# Patient Record
Sex: Male | Born: 1935 | Race: White | Hispanic: No | Smoking: Former smoker
Health system: Southern US, Community
[De-identification: ages and names within clinical notes are randomized; demographics above are authoritative.]

## PROBLEM LIST (undated history)

## (undated) DIAGNOSIS — C9590 Leukemia, unspecified not having achieved remission: Secondary | ICD-10-CM

## (undated) DIAGNOSIS — K2270 Barrett's esophagus without dysplasia: Secondary | ICD-10-CM

## (undated) DIAGNOSIS — J45909 Unspecified asthma, uncomplicated: Secondary | ICD-10-CM

## (undated) DIAGNOSIS — C159 Malignant neoplasm of esophagus, unspecified: Secondary | ICD-10-CM

## (undated) HISTORY — PX: TONSILLECTOMY: SUR1361

## (undated) HISTORY — DX: Barrett's esophagus without dysplasia: K22.70

## (undated) HISTORY — DX: Malignant neoplasm of esophagus, unspecified: C15.9

## (undated) HISTORY — PX: APPENDECTOMY: SHX54

---

## 2001-09-20 ENCOUNTER — Other Ambulatory Visit: Admission: RE | Admit: 2001-09-20 | Discharge: 2001-09-20 | Payer: Self-pay | Admitting: Dermatology

## 2005-12-20 ENCOUNTER — Ambulatory Visit: Payer: Self-pay | Admitting: Internal Medicine

## 2006-01-17 ENCOUNTER — Ambulatory Visit: Payer: Self-pay | Admitting: Internal Medicine

## 2011-02-16 DIAGNOSIS — R0789 Other chest pain: Secondary | ICD-10-CM

## 2011-05-30 ENCOUNTER — Other Ambulatory Visit: Payer: Self-pay | Admitting: Radiation Oncology

## 2011-05-30 DIAGNOSIS — C159 Malignant neoplasm of esophagus, unspecified: Secondary | ICD-10-CM

## 2011-06-03 ENCOUNTER — Other Ambulatory Visit (HOSPITAL_COMMUNITY): Payer: Self-pay | Admitting: Internal Medicine

## 2011-06-03 DIAGNOSIS — C159 Malignant neoplasm of esophagus, unspecified: Secondary | ICD-10-CM

## 2011-06-07 ENCOUNTER — Encounter (HOSPITAL_COMMUNITY)
Admission: RE | Admit: 2011-06-07 | Discharge: 2011-06-07 | Disposition: A | Discharge status: Home / Self Care | Payer: Medicare Other | Source: Ambulatory Visit | Attending: Radiation Oncology | Admitting: Radiation Oncology

## 2011-06-07 DIAGNOSIS — C772 Secondary and unspecified malignant neoplasm of intra-abdominal lymph nodes: Secondary | ICD-10-CM | POA: Insufficient documentation

## 2011-06-07 DIAGNOSIS — C159 Malignant neoplasm of esophagus, unspecified: Secondary | ICD-10-CM | POA: Insufficient documentation

## 2011-06-07 LAB — GLUCOSE, CAPILLARY: Glucose-Capillary: 112 mg/dL — ABNORMAL HIGH (ref 70–99)

## 2011-06-07 MED ORDER — FLUDEOXYGLUCOSE F - 18 (FDG) INJECTION
17.9000 | Freq: Once | INTRAVENOUS | Status: AC | PRN
  Administered 2011-06-07: 17.9 via INTRAVENOUS

## 2011-06-13 ENCOUNTER — Encounter (HOSPITAL_COMMUNITY): Payer: Self-pay | Admitting: Pharmacy Technician

## 2011-06-14 ENCOUNTER — Other Ambulatory Visit: Payer: Self-pay | Admitting: Radiology

## 2011-06-14 MED ORDER — DEXTROSE 5 % IV SOLN: 1.0000 g | Freq: Once | INTRAVENOUS | Status: DC

## 2011-06-15 ENCOUNTER — Other Ambulatory Visit (HOSPITAL_COMMUNITY): Payer: Self-pay | Admitting: Internal Medicine

## 2011-06-15 ENCOUNTER — Ambulatory Visit (HOSPITAL_COMMUNITY)
Admission: RE | Admit: 2011-06-15 | Discharge: 2011-06-15 | Disposition: A | Discharge status: Home / Self Care | Payer: Medicare Other | Source: Ambulatory Visit | Attending: Internal Medicine | Admitting: Internal Medicine

## 2011-06-15 VITALS — BP 117/67 | HR 60 | Temp 98.2°F | Resp 18 | Ht 71.0 in | Wt 185.0 lb

## 2011-06-15 DIAGNOSIS — C159 Malignant neoplasm of esophagus, unspecified: Secondary | ICD-10-CM

## 2011-06-15 DIAGNOSIS — Z79899 Other long term (current) drug therapy: Secondary | ICD-10-CM | POA: Insufficient documentation

## 2011-06-15 DIAGNOSIS — Z7982 Long term (current) use of aspirin: Secondary | ICD-10-CM | POA: Insufficient documentation

## 2011-06-15 DIAGNOSIS — Z856 Personal history of leukemia: Secondary | ICD-10-CM | POA: Insufficient documentation

## 2011-06-15 MED ORDER — SODIUM CHLORIDE 0.9 % IR SOLN
Freq: Once | Status: AC
  Administered 2011-06-15: 14:00:00

## 2011-06-15 MED ORDER — SODIUM CHLORIDE 0.9 % IV SOLN: INTRAVENOUS | Status: DC

## 2011-06-15 MED ORDER — FENTANYL CITRATE 0.05 MG/ML IJ SOLN
INTRAMUSCULAR | Status: DC | PRN
  Administered 2011-06-15: 100 ug via INTRAVENOUS

## 2011-06-15 MED ORDER — MIDAZOLAM HCL 5 MG/5ML IJ SOLN
INTRAMUSCULAR | Status: DC | PRN
  Administered 2011-06-15: 2 mg via INTRAVENOUS

## 2011-06-15 MED ORDER — CEFAZOLIN SODIUM 1-5 GM-% IV SOLN
1.0000 g | Freq: Once | INTRAVENOUS | Status: AC
  Administered 2011-06-15: 1 g via INTRAVENOUS
  Filled 2011-06-15: qty 50

## 2011-06-15 NOTE — ED Notes (Signed)
Patient is resting comfortably. 

## 2011-06-15 NOTE — ED Notes (Signed)
Patient denies pain and is resting comfortably.  

## 2011-06-15 NOTE — H&P (Addendum)
Geoffrey Hudson is an 77 y.o. male.   Chief Complaint: Esophogeal cancer HPI: Portacath placement today Dr. Miles Hudson per Dr Geoffrey Hudson order for chemotherapy.  No past medical history on file. 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 No CAD. No DM.  Former smoker - denies COPD.  No past surgical history on file. Chole 16 years ago. Appy and tonsils as a child  No family history on file. Social History:  does not have a smoking history on file. He does not have any smokeless tobacco history on file. His alcohol and drug histories not on file.  Allergies: No Known Allergies  Medications Prior to Admission  Medication Sig Dispense Refill  . aspirin EC 81 MG tablet Take 81 mg by mouth daily.        . Cholecalciferol (VITAMIN D) 2000 UNITS CAPS Take 2,000 Units by mouth daily.        . citalopram (CELEXA) 20 MG tablet Take 20 mg by mouth every morning.        . dutasteride (AVODART) 0.5 MG capsule Take 0.5 mg by mouth daily.        . Fluticasone-Salmeterol (ADVAIR) 100-50 MCG/DOSE AEPB Inhale 1 puff into the lungs 2 (two) times daily.        . montelukast (SINGULAIR) 10 MG tablet Take 10 mg by mouth daily.        . pantoprazole (PROTONIX) 40 MG tablet Take 40 mg by mouth every other day.        . Tamsulosin HCl (FLOMAX) 0.4 MG CAPS Take 0.4 mg by mouth daily.         Medications Prior to Admission  Medication Dose Route Frequency Provider Last Rate Last Dose  . 0.9 %  sodium chloride infusion   Intravenous Continuous Geoffrey Leu, PA      . ceFAZolin (ANCEF) IVPB 1 g/50 mL premix  1 g Intravenous Once Geoffrey Leu, PA      . DISCONTD: ceFAZolin (ANCEF) 1 g in dextrose 5 % 50 mL IVPB  1 g Intravenous Once Geoffrey Leu, PA        No results found for this or any previous visit (from the past 48 hour(s)). No results found.  Review of Systems  Constitutional: Positive for weight loss. Negative for fever and chills.  Respiratory: Negative for shortness of  breath.   Cardiovascular: Negative for chest pain and palpitations.  Gastrointestinal: Negative for abdominal pain.  Musculoskeletal: Positive for myalgias.  Endo/Heme/Allergies: Bruises/bleeds easily.    Blood pressure 120/73, pulse 67, temperature 98.2 F (36.8 C), SpO2 97.00%. Physical Exam  Heent - unremarkable Heart - RRR Lungs - Clear Abd - non tender  Assessment/Plan Portacath placement explained along with risks and benefits. Informed consent obtained.  Placement scheduled for today Dr. Miles Hudson.  Geoffrey Hudson 06/15/2011, 12:28 PM

## 2011-06-15 NOTE — Procedures (Signed)
Successful RT IJ power Port Tip svc/ra Ready to use No comp  Full radiology report dictated

## 2011-07-20 DIAGNOSIS — IMO0002 Reserved for concepts with insufficient information to code with codable children: Secondary | ICD-10-CM | POA: Diagnosis not present

## 2011-07-20 DIAGNOSIS — Z7982 Long term (current) use of aspirin: Secondary | ICD-10-CM | POA: Diagnosis not present

## 2011-07-20 DIAGNOSIS — K5900 Constipation, unspecified: Secondary | ICD-10-CM | POA: Diagnosis not present

## 2011-07-20 DIAGNOSIS — F329 Major depressive disorder, single episode, unspecified: Secondary | ICD-10-CM | POA: Diagnosis not present

## 2011-07-20 DIAGNOSIS — C155 Malignant neoplasm of lower third of esophagus: Secondary | ICD-10-CM | POA: Diagnosis not present

## 2011-07-20 DIAGNOSIS — Z510 Encounter for antineoplastic radiation therapy: Secondary | ICD-10-CM | POA: Diagnosis not present

## 2011-07-20 DIAGNOSIS — Z5111 Encounter for antineoplastic chemotherapy: Secondary | ICD-10-CM | POA: Diagnosis not present

## 2011-07-20 DIAGNOSIS — Z79899 Other long term (current) drug therapy: Secondary | ICD-10-CM | POA: Diagnosis not present

## 2011-07-21 DIAGNOSIS — Z5111 Encounter for antineoplastic chemotherapy: Secondary | ICD-10-CM | POA: Diagnosis not present

## 2011-07-21 DIAGNOSIS — F329 Major depressive disorder, single episode, unspecified: Secondary | ICD-10-CM | POA: Diagnosis not present

## 2011-07-21 DIAGNOSIS — Z79899 Other long term (current) drug therapy: Secondary | ICD-10-CM | POA: Diagnosis not present

## 2011-07-21 DIAGNOSIS — C155 Malignant neoplasm of lower third of esophagus: Secondary | ICD-10-CM | POA: Diagnosis not present

## 2011-07-21 DIAGNOSIS — Z510 Encounter for antineoplastic radiation therapy: Secondary | ICD-10-CM | POA: Diagnosis not present

## 2011-07-21 DIAGNOSIS — K5900 Constipation, unspecified: Secondary | ICD-10-CM | POA: Diagnosis not present

## 2011-07-22 DIAGNOSIS — K5900 Constipation, unspecified: Secondary | ICD-10-CM | POA: Diagnosis not present

## 2011-07-22 DIAGNOSIS — C155 Malignant neoplasm of lower third of esophagus: Secondary | ICD-10-CM | POA: Diagnosis not present

## 2011-07-22 DIAGNOSIS — Z510 Encounter for antineoplastic radiation therapy: Secondary | ICD-10-CM | POA: Diagnosis not present

## 2011-07-22 DIAGNOSIS — Z5111 Encounter for antineoplastic chemotherapy: Secondary | ICD-10-CM | POA: Diagnosis not present

## 2011-07-22 DIAGNOSIS — F329 Major depressive disorder, single episode, unspecified: Secondary | ICD-10-CM | POA: Diagnosis not present

## 2011-07-22 DIAGNOSIS — Z79899 Other long term (current) drug therapy: Secondary | ICD-10-CM | POA: Diagnosis not present

## 2011-07-25 DIAGNOSIS — C155 Malignant neoplasm of lower third of esophagus: Secondary | ICD-10-CM | POA: Diagnosis not present

## 2011-07-25 DIAGNOSIS — K5900 Constipation, unspecified: Secondary | ICD-10-CM | POA: Diagnosis not present

## 2011-07-25 DIAGNOSIS — F329 Major depressive disorder, single episode, unspecified: Secondary | ICD-10-CM | POA: Diagnosis not present

## 2011-07-25 DIAGNOSIS — Z510 Encounter for antineoplastic radiation therapy: Secondary | ICD-10-CM | POA: Diagnosis not present

## 2011-07-25 DIAGNOSIS — Z5111 Encounter for antineoplastic chemotherapy: Secondary | ICD-10-CM | POA: Diagnosis not present

## 2011-07-25 DIAGNOSIS — Z79899 Other long term (current) drug therapy: Secondary | ICD-10-CM | POA: Diagnosis not present

## 2011-07-26 DIAGNOSIS — C155 Malignant neoplasm of lower third of esophagus: Secondary | ICD-10-CM | POA: Diagnosis not present

## 2011-07-26 DIAGNOSIS — Z5111 Encounter for antineoplastic chemotherapy: Secondary | ICD-10-CM | POA: Diagnosis not present

## 2011-07-26 DIAGNOSIS — F329 Major depressive disorder, single episode, unspecified: Secondary | ICD-10-CM | POA: Diagnosis not present

## 2011-07-26 DIAGNOSIS — Z510 Encounter for antineoplastic radiation therapy: Secondary | ICD-10-CM | POA: Diagnosis not present

## 2011-07-26 DIAGNOSIS — Z79899 Other long term (current) drug therapy: Secondary | ICD-10-CM | POA: Diagnosis not present

## 2011-07-26 DIAGNOSIS — K5900 Constipation, unspecified: Secondary | ICD-10-CM | POA: Diagnosis not present

## 2011-07-27 DIAGNOSIS — Z5111 Encounter for antineoplastic chemotherapy: Secondary | ICD-10-CM | POA: Diagnosis not present

## 2011-07-27 DIAGNOSIS — K5900 Constipation, unspecified: Secondary | ICD-10-CM | POA: Diagnosis not present

## 2011-07-27 DIAGNOSIS — Z510 Encounter for antineoplastic radiation therapy: Secondary | ICD-10-CM | POA: Diagnosis not present

## 2011-07-27 DIAGNOSIS — F329 Major depressive disorder, single episode, unspecified: Secondary | ICD-10-CM | POA: Diagnosis not present

## 2011-07-27 DIAGNOSIS — C155 Malignant neoplasm of lower third of esophagus: Secondary | ICD-10-CM | POA: Diagnosis not present

## 2011-07-27 DIAGNOSIS — Z79899 Other long term (current) drug therapy: Secondary | ICD-10-CM | POA: Diagnosis not present

## 2011-07-28 DIAGNOSIS — Z79899 Other long term (current) drug therapy: Secondary | ICD-10-CM | POA: Diagnosis not present

## 2011-07-28 DIAGNOSIS — Z5111 Encounter for antineoplastic chemotherapy: Secondary | ICD-10-CM | POA: Diagnosis not present

## 2011-07-28 DIAGNOSIS — F329 Major depressive disorder, single episode, unspecified: Secondary | ICD-10-CM | POA: Diagnosis not present

## 2011-07-28 DIAGNOSIS — Z510 Encounter for antineoplastic radiation therapy: Secondary | ICD-10-CM | POA: Diagnosis not present

## 2011-07-28 DIAGNOSIS — C155 Malignant neoplasm of lower third of esophagus: Secondary | ICD-10-CM | POA: Diagnosis not present

## 2011-07-28 DIAGNOSIS — K5900 Constipation, unspecified: Secondary | ICD-10-CM | POA: Diagnosis not present

## 2011-07-29 DIAGNOSIS — F329 Major depressive disorder, single episode, unspecified: Secondary | ICD-10-CM | POA: Diagnosis not present

## 2011-07-29 DIAGNOSIS — Z510 Encounter for antineoplastic radiation therapy: Secondary | ICD-10-CM | POA: Diagnosis not present

## 2011-07-29 DIAGNOSIS — Z79899 Other long term (current) drug therapy: Secondary | ICD-10-CM | POA: Diagnosis not present

## 2011-07-29 DIAGNOSIS — IMO0002 Reserved for concepts with insufficient information to code with codable children: Secondary | ICD-10-CM | POA: Diagnosis not present

## 2011-07-29 DIAGNOSIS — K5900 Constipation, unspecified: Secondary | ICD-10-CM | POA: Diagnosis not present

## 2011-07-29 DIAGNOSIS — Z5111 Encounter for antineoplastic chemotherapy: Secondary | ICD-10-CM | POA: Diagnosis not present

## 2011-07-29 DIAGNOSIS — C155 Malignant neoplasm of lower third of esophagus: Secondary | ICD-10-CM | POA: Diagnosis not present

## 2011-07-29 DIAGNOSIS — Z7982 Long term (current) use of aspirin: Secondary | ICD-10-CM | POA: Diagnosis not present

## 2011-08-01 DIAGNOSIS — K5900 Constipation, unspecified: Secondary | ICD-10-CM | POA: Diagnosis not present

## 2011-08-01 DIAGNOSIS — Z79899 Other long term (current) drug therapy: Secondary | ICD-10-CM | POA: Diagnosis not present

## 2011-08-01 DIAGNOSIS — F329 Major depressive disorder, single episode, unspecified: Secondary | ICD-10-CM | POA: Diagnosis not present

## 2011-08-01 DIAGNOSIS — Z510 Encounter for antineoplastic radiation therapy: Secondary | ICD-10-CM | POA: Diagnosis not present

## 2011-08-01 DIAGNOSIS — C155 Malignant neoplasm of lower third of esophagus: Secondary | ICD-10-CM | POA: Diagnosis not present

## 2011-08-01 DIAGNOSIS — Z5111 Encounter for antineoplastic chemotherapy: Secondary | ICD-10-CM | POA: Diagnosis not present

## 2011-08-11 DIAGNOSIS — C159 Malignant neoplasm of esophagus, unspecified: Secondary | ICD-10-CM | POA: Diagnosis not present

## 2011-08-17 DIAGNOSIS — K449 Diaphragmatic hernia without obstruction or gangrene: Secondary | ICD-10-CM | POA: Diagnosis not present

## 2011-08-17 DIAGNOSIS — Z79899 Other long term (current) drug therapy: Secondary | ICD-10-CM | POA: Diagnosis not present

## 2011-08-17 DIAGNOSIS — Z923 Personal history of irradiation: Secondary | ICD-10-CM | POA: Diagnosis not present

## 2011-08-17 DIAGNOSIS — T66XXXS Radiation sickness, unspecified, sequela: Secondary | ICD-10-CM | POA: Diagnosis not present

## 2011-08-17 DIAGNOSIS — Z9221 Personal history of antineoplastic chemotherapy: Secondary | ICD-10-CM | POA: Diagnosis not present

## 2011-08-17 DIAGNOSIS — K2270 Barrett's esophagus without dysplasia: Secondary | ICD-10-CM | POA: Diagnosis not present

## 2011-08-17 DIAGNOSIS — K219 Gastro-esophageal reflux disease without esophagitis: Secondary | ICD-10-CM | POA: Diagnosis not present

## 2011-08-17 DIAGNOSIS — N400 Enlarged prostate without lower urinary tract symptoms: Secondary | ICD-10-CM | POA: Diagnosis not present

## 2011-08-17 DIAGNOSIS — Z7982 Long term (current) use of aspirin: Secondary | ICD-10-CM | POA: Diagnosis not present

## 2011-08-17 DIAGNOSIS — J45909 Unspecified asthma, uncomplicated: Secondary | ICD-10-CM | POA: Diagnosis not present

## 2011-08-17 DIAGNOSIS — C159 Malignant neoplasm of esophagus, unspecified: Secondary | ICD-10-CM | POA: Diagnosis not present

## 2011-08-17 DIAGNOSIS — Z856 Personal history of leukemia: Secondary | ICD-10-CM | POA: Diagnosis not present

## 2011-08-17 DIAGNOSIS — F329 Major depressive disorder, single episode, unspecified: Secondary | ICD-10-CM | POA: Diagnosis not present

## 2011-08-26 DIAGNOSIS — K2289 Other specified disease of esophagus: Secondary | ICD-10-CM | POA: Diagnosis not present

## 2011-08-29 DIAGNOSIS — C9140 Hairy cell leukemia not having achieved remission: Secondary | ICD-10-CM | POA: Diagnosis not present

## 2011-08-29 DIAGNOSIS — Z9221 Personal history of antineoplastic chemotherapy: Secondary | ICD-10-CM | POA: Diagnosis not present

## 2011-08-29 DIAGNOSIS — K5900 Constipation, unspecified: Secondary | ICD-10-CM | POA: Diagnosis not present

## 2011-08-29 DIAGNOSIS — D6481 Anemia due to antineoplastic chemotherapy: Secondary | ICD-10-CM | POA: Diagnosis not present

## 2011-08-29 DIAGNOSIS — Z923 Personal history of irradiation: Secondary | ICD-10-CM | POA: Diagnosis not present

## 2011-08-29 DIAGNOSIS — Z452 Encounter for adjustment and management of vascular access device: Secondary | ICD-10-CM | POA: Diagnosis not present

## 2011-08-29 DIAGNOSIS — T451X5A Adverse effect of antineoplastic and immunosuppressive drugs, initial encounter: Secondary | ICD-10-CM | POA: Diagnosis not present

## 2011-08-29 DIAGNOSIS — Z7982 Long term (current) use of aspirin: Secondary | ICD-10-CM | POA: Diagnosis not present

## 2011-08-29 DIAGNOSIS — C155 Malignant neoplasm of lower third of esophagus: Secondary | ICD-10-CM | POA: Diagnosis not present

## 2011-08-29 DIAGNOSIS — IMO0002 Reserved for concepts with insufficient information to code with codable children: Secondary | ICD-10-CM | POA: Diagnosis not present

## 2011-08-29 DIAGNOSIS — Z79899 Other long term (current) drug therapy: Secondary | ICD-10-CM | POA: Diagnosis not present

## 2011-08-29 DIAGNOSIS — F329 Major depressive disorder, single episode, unspecified: Secondary | ICD-10-CM | POA: Diagnosis not present

## 2011-09-01 DIAGNOSIS — Z452 Encounter for adjustment and management of vascular access device: Secondary | ICD-10-CM | POA: Diagnosis not present

## 2011-09-01 DIAGNOSIS — D6481 Anemia due to antineoplastic chemotherapy: Secondary | ICD-10-CM | POA: Diagnosis not present

## 2011-09-01 DIAGNOSIS — F329 Major depressive disorder, single episode, unspecified: Secondary | ICD-10-CM | POA: Diagnosis not present

## 2011-09-01 DIAGNOSIS — Z79899 Other long term (current) drug therapy: Secondary | ICD-10-CM | POA: Diagnosis not present

## 2011-09-01 DIAGNOSIS — K5900 Constipation, unspecified: Secondary | ICD-10-CM | POA: Diagnosis not present

## 2011-09-01 DIAGNOSIS — C9140 Hairy cell leukemia not having achieved remission: Secondary | ICD-10-CM | POA: Diagnosis not present

## 2011-09-01 DIAGNOSIS — IMO0002 Reserved for concepts with insufficient information to code with codable children: Secondary | ICD-10-CM | POA: Diagnosis not present

## 2011-09-01 DIAGNOSIS — C155 Malignant neoplasm of lower third of esophagus: Secondary | ICD-10-CM | POA: Diagnosis not present

## 2011-09-13 DIAGNOSIS — F329 Major depressive disorder, single episode, unspecified: Secondary | ICD-10-CM | POA: Diagnosis not present

## 2011-09-13 DIAGNOSIS — K5900 Constipation, unspecified: Secondary | ICD-10-CM | POA: Diagnosis not present

## 2011-09-13 DIAGNOSIS — C155 Malignant neoplasm of lower third of esophagus: Secondary | ICD-10-CM | POA: Diagnosis not present

## 2011-09-13 DIAGNOSIS — C9140 Hairy cell leukemia not having achieved remission: Secondary | ICD-10-CM | POA: Diagnosis not present

## 2011-09-13 DIAGNOSIS — Z452 Encounter for adjustment and management of vascular access device: Secondary | ICD-10-CM | POA: Diagnosis not present

## 2011-09-13 DIAGNOSIS — T451X5A Adverse effect of antineoplastic and immunosuppressive drugs, initial encounter: Secondary | ICD-10-CM | POA: Diagnosis not present

## 2011-10-10 DIAGNOSIS — Z800 Family history of malignant neoplasm of digestive organs: Secondary | ICD-10-CM | POA: Diagnosis not present

## 2011-10-10 DIAGNOSIS — K219 Gastro-esophageal reflux disease without esophagitis: Secondary | ICD-10-CM | POA: Diagnosis not present

## 2011-10-10 DIAGNOSIS — C9140 Hairy cell leukemia not having achieved remission: Secondary | ICD-10-CM | POA: Diagnosis not present

## 2011-10-10 DIAGNOSIS — Z09 Encounter for follow-up examination after completed treatment for conditions other than malignant neoplasm: Secondary | ICD-10-CM | POA: Diagnosis not present

## 2011-10-10 DIAGNOSIS — Z91018 Allergy to other foods: Secondary | ICD-10-CM | POA: Diagnosis not present

## 2011-10-10 DIAGNOSIS — C159 Malignant neoplasm of esophagus, unspecified: Secondary | ICD-10-CM | POA: Diagnosis not present

## 2011-10-10 DIAGNOSIS — Z7982 Long term (current) use of aspirin: Secondary | ICD-10-CM | POA: Diagnosis not present

## 2011-10-10 DIAGNOSIS — Z79899 Other long term (current) drug therapy: Secondary | ICD-10-CM | POA: Diagnosis not present

## 2011-10-10 DIAGNOSIS — F17200 Nicotine dependence, unspecified, uncomplicated: Secondary | ICD-10-CM | POA: Diagnosis not present

## 2011-10-25 DIAGNOSIS — Z452 Encounter for adjustment and management of vascular access device: Secondary | ICD-10-CM | POA: Diagnosis not present

## 2011-11-25 DIAGNOSIS — C159 Malignant neoplasm of esophagus, unspecified: Secondary | ICD-10-CM | POA: Diagnosis not present

## 2011-12-06 ENCOUNTER — Encounter: Payer: Medicare Other | Admitting: Internal Medicine

## 2011-12-06 DIAGNOSIS — C159 Malignant neoplasm of esophagus, unspecified: Secondary | ICD-10-CM

## 2011-12-06 DIAGNOSIS — Z452 Encounter for adjustment and management of vascular access device: Secondary | ICD-10-CM

## 2011-12-09 DIAGNOSIS — C159 Malignant neoplasm of esophagus, unspecified: Secondary | ICD-10-CM | POA: Diagnosis not present

## 2011-12-09 DIAGNOSIS — C155 Malignant neoplasm of lower third of esophagus: Secondary | ICD-10-CM | POA: Diagnosis not present

## 2011-12-09 DIAGNOSIS — Z923 Personal history of irradiation: Secondary | ICD-10-CM | POA: Diagnosis not present

## 2012-01-12 DIAGNOSIS — D649 Anemia, unspecified: Secondary | ICD-10-CM | POA: Diagnosis not present

## 2012-01-12 DIAGNOSIS — C159 Malignant neoplasm of esophagus, unspecified: Secondary | ICD-10-CM | POA: Diagnosis not present

## 2012-01-12 DIAGNOSIS — C9140 Hairy cell leukemia not having achieved remission: Secondary | ICD-10-CM | POA: Diagnosis not present

## 2012-01-13 ENCOUNTER — Encounter: Payer: Medicare Other | Admitting: Internal Medicine

## 2012-01-13 DIAGNOSIS — Z452 Encounter for adjustment and management of vascular access device: Secondary | ICD-10-CM | POA: Diagnosis not present

## 2012-01-13 DIAGNOSIS — C159 Malignant neoplasm of esophagus, unspecified: Secondary | ICD-10-CM

## 2012-02-17 DIAGNOSIS — K2270 Barrett's esophagus without dysplasia: Secondary | ICD-10-CM | POA: Diagnosis not present

## 2012-02-17 DIAGNOSIS — C159 Malignant neoplasm of esophagus, unspecified: Secondary | ICD-10-CM | POA: Diagnosis not present

## 2012-02-21 ENCOUNTER — Encounter: Payer: Medicare Other | Admitting: Hematology and Oncology

## 2012-02-21 DIAGNOSIS — C159 Malignant neoplasm of esophagus, unspecified: Secondary | ICD-10-CM | POA: Diagnosis not present

## 2012-02-21 DIAGNOSIS — Z452 Encounter for adjustment and management of vascular access device: Secondary | ICD-10-CM

## 2012-02-22 DIAGNOSIS — C159 Malignant neoplasm of esophagus, unspecified: Secondary | ICD-10-CM | POA: Diagnosis not present

## 2012-02-22 DIAGNOSIS — F329 Major depressive disorder, single episode, unspecified: Secondary | ICD-10-CM | POA: Diagnosis not present

## 2012-02-22 DIAGNOSIS — IMO0002 Reserved for concepts with insufficient information to code with codable children: Secondary | ICD-10-CM | POA: Diagnosis not present

## 2012-02-22 DIAGNOSIS — Z79899 Other long term (current) drug therapy: Secondary | ICD-10-CM | POA: Diagnosis not present

## 2012-02-22 DIAGNOSIS — Z856 Personal history of leukemia: Secondary | ICD-10-CM | POA: Diagnosis not present

## 2012-02-22 DIAGNOSIS — R87619 Unspecified abnormal cytological findings in specimens from cervix uteri: Secondary | ICD-10-CM | POA: Diagnosis not present

## 2012-02-22 DIAGNOSIS — N400 Enlarged prostate without lower urinary tract symptoms: Secondary | ICD-10-CM | POA: Diagnosis not present

## 2012-02-22 DIAGNOSIS — K2270 Barrett's esophagus without dysplasia: Secondary | ICD-10-CM | POA: Diagnosis not present

## 2012-02-22 DIAGNOSIS — K219 Gastro-esophageal reflux disease without esophagitis: Secondary | ICD-10-CM | POA: Diagnosis not present

## 2012-02-22 DIAGNOSIS — K449 Diaphragmatic hernia without obstruction or gangrene: Secondary | ICD-10-CM | POA: Diagnosis not present

## 2012-02-22 DIAGNOSIS — F17200 Nicotine dependence, unspecified, uncomplicated: Secondary | ICD-10-CM | POA: Diagnosis not present

## 2012-02-22 DIAGNOSIS — J45909 Unspecified asthma, uncomplicated: Secondary | ICD-10-CM | POA: Diagnosis not present

## 2012-03-02 DIAGNOSIS — K2270 Barrett's esophagus without dysplasia: Secondary | ICD-10-CM | POA: Diagnosis not present

## 2012-03-26 DIAGNOSIS — R21 Rash and other nonspecific skin eruption: Secondary | ICD-10-CM | POA: Diagnosis not present

## 2012-04-03 ENCOUNTER — Encounter: Payer: Medicare Other | Admitting: Hematology and Oncology

## 2012-04-03 DIAGNOSIS — C159 Malignant neoplasm of esophagus, unspecified: Secondary | ICD-10-CM | POA: Diagnosis not present

## 2012-04-03 DIAGNOSIS — Z452 Encounter for adjustment and management of vascular access device: Secondary | ICD-10-CM

## 2012-04-04 DIAGNOSIS — H26499 Other secondary cataract, unspecified eye: Secondary | ICD-10-CM | POA: Diagnosis not present

## 2012-04-11 DIAGNOSIS — C9140 Hairy cell leukemia not having achieved remission: Secondary | ICD-10-CM | POA: Diagnosis not present

## 2012-05-10 DIAGNOSIS — L821 Other seborrheic keratosis: Secondary | ICD-10-CM | POA: Diagnosis not present

## 2012-05-10 DIAGNOSIS — D235 Other benign neoplasm of skin of trunk: Secondary | ICD-10-CM | POA: Diagnosis not present

## 2012-05-10 DIAGNOSIS — L570 Actinic keratosis: Secondary | ICD-10-CM | POA: Diagnosis not present

## 2012-05-15 DIAGNOSIS — C159 Malignant neoplasm of esophagus, unspecified: Secondary | ICD-10-CM

## 2012-05-15 DIAGNOSIS — Z452 Encounter for adjustment and management of vascular access device: Secondary | ICD-10-CM | POA: Diagnosis not present

## 2012-05-31 DIAGNOSIS — Z8501 Personal history of malignant neoplasm of esophagus: Secondary | ICD-10-CM | POA: Diagnosis not present

## 2012-05-31 DIAGNOSIS — C155 Malignant neoplasm of lower third of esophagus: Secondary | ICD-10-CM | POA: Diagnosis not present

## 2012-05-31 DIAGNOSIS — Z09 Encounter for follow-up examination after completed treatment for conditions other than malignant neoplasm: Secondary | ICD-10-CM | POA: Diagnosis not present

## 2012-06-05 DIAGNOSIS — J309 Allergic rhinitis, unspecified: Secondary | ICD-10-CM | POA: Diagnosis not present

## 2012-06-05 DIAGNOSIS — J45902 Unspecified asthma with status asthmaticus: Secondary | ICD-10-CM | POA: Diagnosis not present

## 2012-06-05 DIAGNOSIS — IMO0002 Reserved for concepts with insufficient information to code with codable children: Secondary | ICD-10-CM | POA: Diagnosis not present

## 2012-06-15 DIAGNOSIS — IMO0002 Reserved for concepts with insufficient information to code with codable children: Secondary | ICD-10-CM | POA: Diagnosis not present

## 2012-06-22 DIAGNOSIS — Z23 Encounter for immunization: Secondary | ICD-10-CM | POA: Diagnosis not present

## 2012-06-26 ENCOUNTER — Encounter: Payer: Medicare Other | Admitting: Internal Medicine

## 2012-06-26 DIAGNOSIS — C159 Malignant neoplasm of esophagus, unspecified: Secondary | ICD-10-CM

## 2012-08-22 DIAGNOSIS — IMO0002 Reserved for concepts with insufficient information to code with codable children: Secondary | ICD-10-CM | POA: Diagnosis not present

## 2012-08-22 DIAGNOSIS — J309 Allergic rhinitis, unspecified: Secondary | ICD-10-CM | POA: Diagnosis not present

## 2012-08-22 DIAGNOSIS — C159 Malignant neoplasm of esophagus, unspecified: Secondary | ICD-10-CM | POA: Diagnosis not present

## 2012-08-22 DIAGNOSIS — J45902 Unspecified asthma with status asthmaticus: Secondary | ICD-10-CM | POA: Diagnosis not present

## 2012-08-28 DIAGNOSIS — C159 Malignant neoplasm of esophagus, unspecified: Secondary | ICD-10-CM | POA: Diagnosis not present

## 2012-08-28 DIAGNOSIS — Z452 Encounter for adjustment and management of vascular access device: Secondary | ICD-10-CM | POA: Diagnosis not present

## 2012-10-15 DIAGNOSIS — D61818 Other pancytopenia: Secondary | ICD-10-CM | POA: Diagnosis not present

## 2012-10-15 DIAGNOSIS — C9140 Hairy cell leukemia not having achieved remission: Secondary | ICD-10-CM | POA: Diagnosis not present

## 2012-10-15 DIAGNOSIS — Z8501 Personal history of malignant neoplasm of esophagus: Secondary | ICD-10-CM | POA: Diagnosis not present

## 2012-10-17 DIAGNOSIS — Z452 Encounter for adjustment and management of vascular access device: Secondary | ICD-10-CM | POA: Diagnosis not present

## 2012-10-17 DIAGNOSIS — C159 Malignant neoplasm of esophagus, unspecified: Secondary | ICD-10-CM

## 2012-11-05 DIAGNOSIS — J309 Allergic rhinitis, unspecified: Secondary | ICD-10-CM | POA: Diagnosis not present

## 2012-11-28 DIAGNOSIS — C159 Malignant neoplasm of esophagus, unspecified: Secondary | ICD-10-CM | POA: Diagnosis not present

## 2012-11-28 DIAGNOSIS — Z452 Encounter for adjustment and management of vascular access device: Secondary | ICD-10-CM

## 2013-01-09 DIAGNOSIS — C159 Malignant neoplasm of esophagus, unspecified: Secondary | ICD-10-CM

## 2013-01-09 DIAGNOSIS — Z452 Encounter for adjustment and management of vascular access device: Secondary | ICD-10-CM | POA: Diagnosis not present

## 2013-01-21 DIAGNOSIS — C159 Malignant neoplasm of esophagus, unspecified: Secondary | ICD-10-CM | POA: Diagnosis not present

## 2013-01-21 DIAGNOSIS — D61818 Other pancytopenia: Secondary | ICD-10-CM | POA: Diagnosis not present

## 2013-01-21 DIAGNOSIS — K219 Gastro-esophageal reflux disease without esophagitis: Secondary | ICD-10-CM | POA: Diagnosis not present

## 2013-01-21 DIAGNOSIS — C9140 Hairy cell leukemia not having achieved remission: Secondary | ICD-10-CM | POA: Diagnosis not present

## 2013-02-20 DIAGNOSIS — Z452 Encounter for adjustment and management of vascular access device: Secondary | ICD-10-CM | POA: Diagnosis not present

## 2013-02-20 DIAGNOSIS — C159 Malignant neoplasm of esophagus, unspecified: Secondary | ICD-10-CM | POA: Diagnosis not present

## 2013-02-22 DIAGNOSIS — C159 Malignant neoplasm of esophagus, unspecified: Secondary | ICD-10-CM | POA: Diagnosis not present

## 2013-03-05 DIAGNOSIS — R42 Dizziness and giddiness: Secondary | ICD-10-CM | POA: Diagnosis not present

## 2013-03-05 DIAGNOSIS — J45902 Unspecified asthma with status asthmaticus: Secondary | ICD-10-CM | POA: Diagnosis not present

## 2013-03-05 DIAGNOSIS — C159 Malignant neoplasm of esophagus, unspecified: Secondary | ICD-10-CM | POA: Diagnosis not present

## 2013-03-05 DIAGNOSIS — J309 Allergic rhinitis, unspecified: Secondary | ICD-10-CM | POA: Diagnosis not present

## 2013-03-05 DIAGNOSIS — IMO0002 Reserved for concepts with insufficient information to code with codable children: Secondary | ICD-10-CM | POA: Diagnosis not present

## 2013-03-05 DIAGNOSIS — N400 Enlarged prostate without lower urinary tract symptoms: Secondary | ICD-10-CM | POA: Diagnosis not present

## 2013-03-05 DIAGNOSIS — R630 Anorexia: Secondary | ICD-10-CM | POA: Diagnosis not present

## 2013-03-14 DIAGNOSIS — C159 Malignant neoplasm of esophagus, unspecified: Secondary | ICD-10-CM | POA: Diagnosis not present

## 2013-03-14 DIAGNOSIS — K2270 Barrett's esophagus without dysplasia: Secondary | ICD-10-CM | POA: Diagnosis not present

## 2013-03-27 DIAGNOSIS — M25559 Pain in unspecified hip: Secondary | ICD-10-CM

## 2013-03-27 DIAGNOSIS — D72819 Decreased white blood cell count, unspecified: Secondary | ICD-10-CM | POA: Diagnosis not present

## 2013-03-27 DIAGNOSIS — C159 Malignant neoplasm of esophagus, unspecified: Secondary | ICD-10-CM | POA: Diagnosis not present

## 2013-03-27 DIAGNOSIS — D61818 Other pancytopenia: Secondary | ICD-10-CM | POA: Diagnosis not present

## 2013-03-27 DIAGNOSIS — D509 Iron deficiency anemia, unspecified: Secondary | ICD-10-CM | POA: Diagnosis not present

## 2013-03-29 DIAGNOSIS — D509 Iron deficiency anemia, unspecified: Secondary | ICD-10-CM

## 2013-03-29 DIAGNOSIS — C159 Malignant neoplasm of esophagus, unspecified: Secondary | ICD-10-CM

## 2013-03-29 DIAGNOSIS — D61818 Other pancytopenia: Secondary | ICD-10-CM | POA: Diagnosis not present

## 2013-04-08 DIAGNOSIS — D509 Iron deficiency anemia, unspecified: Secondary | ICD-10-CM | POA: Diagnosis not present

## 2013-04-09 DIAGNOSIS — C159 Malignant neoplasm of esophagus, unspecified: Secondary | ICD-10-CM | POA: Diagnosis not present

## 2013-04-09 DIAGNOSIS — D61818 Other pancytopenia: Secondary | ICD-10-CM | POA: Diagnosis not present

## 2013-04-12 DIAGNOSIS — K219 Gastro-esophageal reflux disease without esophagitis: Secondary | ICD-10-CM | POA: Diagnosis not present

## 2013-04-12 DIAGNOSIS — Z9221 Personal history of antineoplastic chemotherapy: Secondary | ICD-10-CM | POA: Diagnosis not present

## 2013-04-12 DIAGNOSIS — Z8501 Personal history of malignant neoplasm of esophagus: Secondary | ICD-10-CM | POA: Diagnosis not present

## 2013-04-12 DIAGNOSIS — F17200 Nicotine dependence, unspecified, uncomplicated: Secondary | ICD-10-CM | POA: Diagnosis not present

## 2013-04-12 DIAGNOSIS — K449 Diaphragmatic hernia without obstruction or gangrene: Secondary | ICD-10-CM | POA: Diagnosis not present

## 2013-04-12 DIAGNOSIS — IMO0002 Reserved for concepts with insufficient information to code with codable children: Secondary | ICD-10-CM | POA: Diagnosis not present

## 2013-04-12 DIAGNOSIS — F329 Major depressive disorder, single episode, unspecified: Secondary | ICD-10-CM | POA: Diagnosis not present

## 2013-04-12 DIAGNOSIS — J45909 Unspecified asthma, uncomplicated: Secondary | ICD-10-CM | POA: Diagnosis not present

## 2013-04-12 DIAGNOSIS — Z79899 Other long term (current) drug therapy: Secondary | ICD-10-CM | POA: Diagnosis not present

## 2013-04-12 DIAGNOSIS — Z7982 Long term (current) use of aspirin: Secondary | ICD-10-CM | POA: Diagnosis not present

## 2013-04-12 DIAGNOSIS — K2270 Barrett's esophagus without dysplasia: Secondary | ICD-10-CM | POA: Diagnosis not present

## 2013-04-12 DIAGNOSIS — Z856 Personal history of leukemia: Secondary | ICD-10-CM | POA: Diagnosis not present

## 2013-04-12 DIAGNOSIS — Z8489 Family history of other specified conditions: Secondary | ICD-10-CM | POA: Diagnosis not present

## 2013-04-12 DIAGNOSIS — N400 Enlarged prostate without lower urinary tract symptoms: Secondary | ICD-10-CM | POA: Diagnosis not present

## 2013-04-12 DIAGNOSIS — Z808 Family history of malignant neoplasm of other organs or systems: Secondary | ICD-10-CM | POA: Diagnosis not present

## 2013-04-12 DIAGNOSIS — K2980 Duodenitis without bleeding: Secondary | ICD-10-CM | POA: Diagnosis not present

## 2013-04-12 DIAGNOSIS — K31819 Angiodysplasia of stomach and duodenum without bleeding: Secondary | ICD-10-CM | POA: Diagnosis not present

## 2013-04-12 DIAGNOSIS — Z923 Personal history of irradiation: Secondary | ICD-10-CM | POA: Diagnosis not present

## 2013-04-12 DIAGNOSIS — C159 Malignant neoplasm of esophagus, unspecified: Secondary | ICD-10-CM | POA: Diagnosis not present

## 2013-04-15 DIAGNOSIS — D509 Iron deficiency anemia, unspecified: Secondary | ICD-10-CM

## 2013-04-17 DIAGNOSIS — Z856 Personal history of leukemia: Secondary | ICD-10-CM

## 2013-04-17 DIAGNOSIS — C159 Malignant neoplasm of esophagus, unspecified: Secondary | ICD-10-CM

## 2013-04-17 DIAGNOSIS — D61818 Other pancytopenia: Secondary | ICD-10-CM | POA: Diagnosis not present

## 2013-04-17 DIAGNOSIS — C9140 Hairy cell leukemia not having achieved remission: Secondary | ICD-10-CM | POA: Diagnosis not present

## 2013-04-17 DIAGNOSIS — D696 Thrombocytopenia, unspecified: Secondary | ICD-10-CM

## 2013-04-17 DIAGNOSIS — D72819 Decreased white blood cell count, unspecified: Secondary | ICD-10-CM | POA: Diagnosis not present

## 2013-04-22 DIAGNOSIS — Z23 Encounter for immunization: Secondary | ICD-10-CM | POA: Diagnosis not present

## 2013-05-13 DIAGNOSIS — C9140 Hairy cell leukemia not having achieved remission: Secondary | ICD-10-CM | POA: Diagnosis not present

## 2013-05-13 DIAGNOSIS — D61818 Other pancytopenia: Secondary | ICD-10-CM | POA: Diagnosis not present

## 2013-05-13 DIAGNOSIS — C159 Malignant neoplasm of esophagus, unspecified: Secondary | ICD-10-CM

## 2013-05-15 DIAGNOSIS — C159 Malignant neoplasm of esophagus, unspecified: Secondary | ICD-10-CM | POA: Diagnosis not present

## 2013-05-15 DIAGNOSIS — Z452 Encounter for adjustment and management of vascular access device: Secondary | ICD-10-CM | POA: Diagnosis not present

## 2013-06-03 DIAGNOSIS — C9140 Hairy cell leukemia not having achieved remission: Secondary | ICD-10-CM | POA: Diagnosis not present

## 2013-06-06 DIAGNOSIS — M7150 Other bursitis, not elsewhere classified, unspecified site: Secondary | ICD-10-CM | POA: Diagnosis not present

## 2013-06-06 DIAGNOSIS — Z23 Encounter for immunization: Secondary | ICD-10-CM | POA: Diagnosis not present

## 2013-06-26 DIAGNOSIS — C159 Malignant neoplasm of esophagus, unspecified: Secondary | ICD-10-CM

## 2013-06-26 DIAGNOSIS — Z452 Encounter for adjustment and management of vascular access device: Secondary | ICD-10-CM

## 2013-06-28 DIAGNOSIS — M7150 Other bursitis, not elsewhere classified, unspecified site: Secondary | ICD-10-CM | POA: Diagnosis not present

## 2013-07-05 DIAGNOSIS — J45902 Unspecified asthma with status asthmaticus: Secondary | ICD-10-CM | POA: Diagnosis not present

## 2013-07-05 DIAGNOSIS — J019 Acute sinusitis, unspecified: Secondary | ICD-10-CM | POA: Diagnosis not present

## 2013-07-15 DIAGNOSIS — C159 Malignant neoplasm of esophagus, unspecified: Secondary | ICD-10-CM | POA: Diagnosis not present

## 2013-07-15 DIAGNOSIS — D759 Disease of blood and blood-forming organs, unspecified: Secondary | ICD-10-CM | POA: Diagnosis not present

## 2013-07-29 DIAGNOSIS — D7589 Other specified diseases of blood and blood-forming organs: Secondary | ICD-10-CM | POA: Diagnosis not present

## 2013-07-29 DIAGNOSIS — D696 Thrombocytopenia, unspecified: Secondary | ICD-10-CM | POA: Diagnosis not present

## 2013-07-29 DIAGNOSIS — D649 Anemia, unspecified: Secondary | ICD-10-CM | POA: Diagnosis not present

## 2013-07-29 DIAGNOSIS — D61818 Other pancytopenia: Secondary | ICD-10-CM | POA: Diagnosis not present

## 2013-08-07 DIAGNOSIS — C159 Malignant neoplasm of esophagus, unspecified: Secondary | ICD-10-CM | POA: Diagnosis not present

## 2013-08-07 DIAGNOSIS — Z9889 Other specified postprocedural states: Secondary | ICD-10-CM | POA: Diagnosis not present

## 2013-08-19 DIAGNOSIS — N400 Enlarged prostate without lower urinary tract symptoms: Secondary | ICD-10-CM | POA: Diagnosis not present

## 2013-08-19 DIAGNOSIS — R42 Dizziness and giddiness: Secondary | ICD-10-CM | POA: Diagnosis not present

## 2013-08-19 DIAGNOSIS — R630 Anorexia: Secondary | ICD-10-CM | POA: Diagnosis not present

## 2013-08-19 DIAGNOSIS — C159 Malignant neoplasm of esophagus, unspecified: Secondary | ICD-10-CM | POA: Diagnosis not present

## 2013-08-19 DIAGNOSIS — IMO0002 Reserved for concepts with insufficient information to code with codable children: Secondary | ICD-10-CM | POA: Diagnosis not present

## 2013-08-19 DIAGNOSIS — J309 Allergic rhinitis, unspecified: Secondary | ICD-10-CM | POA: Diagnosis not present

## 2013-08-19 DIAGNOSIS — J45902 Unspecified asthma with status asthmaticus: Secondary | ICD-10-CM | POA: Diagnosis not present

## 2013-08-19 DIAGNOSIS — K2100 Gastro-esophageal reflux disease with esophagitis, without bleeding: Secondary | ICD-10-CM | POA: Diagnosis not present

## 2013-09-18 DIAGNOSIS — N400 Enlarged prostate without lower urinary tract symptoms: Secondary | ICD-10-CM | POA: Diagnosis not present

## 2013-09-18 DIAGNOSIS — Z9889 Other specified postprocedural states: Secondary | ICD-10-CM | POA: Diagnosis not present

## 2013-09-18 DIAGNOSIS — C9140 Hairy cell leukemia not having achieved remission: Secondary | ICD-10-CM | POA: Diagnosis not present

## 2013-09-18 DIAGNOSIS — C159 Malignant neoplasm of esophagus, unspecified: Secondary | ICD-10-CM | POA: Diagnosis not present

## 2013-10-04 DIAGNOSIS — IMO0002 Reserved for concepts with insufficient information to code with codable children: Secondary | ICD-10-CM | POA: Diagnosis not present

## 2013-10-07 IMAGING — PT NM PET TUM IMG INITIAL (PI) SKULL BASE T - THIGH
6 series · 25 of 25 positions shown · non-contrast
Comparison: 

CLINICAL DATA: Initial treatment strategy for esophageal cancer.

NUCLEAR MEDICINE PET SKULL BASE TO THIGH
Fasting Blood Glucose:  112
TECHNIQUE: 17.9 mCi F-18 FDG was injected intravenously via the
right antecubital fossa.  Full-ring PET imaging was performed from
the skull base through the mid-thighs 64  minutes after injection.
CT data was obtained and used for attenuation correction and
anatomic localization only.  (This was not acquired as a diagnostic
CT examination.)

[Series 1: pet ac · axial · 3.3mm · 4.69mm/px · z∈[-870,+0]mm · 5 of 267 slices shown]
[im 1/267]
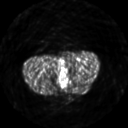
[im 67/267]
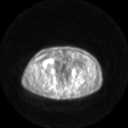
[im 134/267]
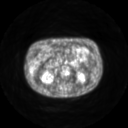
[im 200/267]
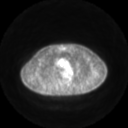
[im 267/267]
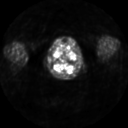

[Series 2: pet nac · axial · 3.3mm · 4.69mm/px · z∈[-870,+0]mm · 6 of 267 slices shown]
[im 1/267]
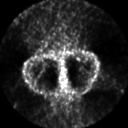
[im 54/267]
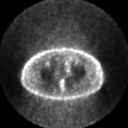
[im 107/267]
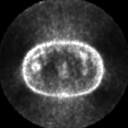
[im 160/267]
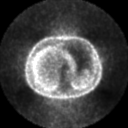
[im 213/267]
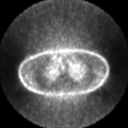
[im 267/267]
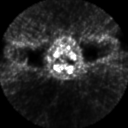

[Series 2: ct images · axial · 3.8mm · 0.98mm/px · z∈[-870,+0]mm · 5 of 265 slices shown]
[im 1/265]
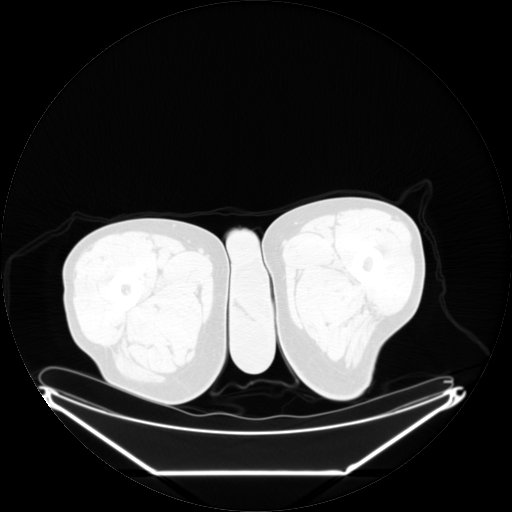
[im 67/265]
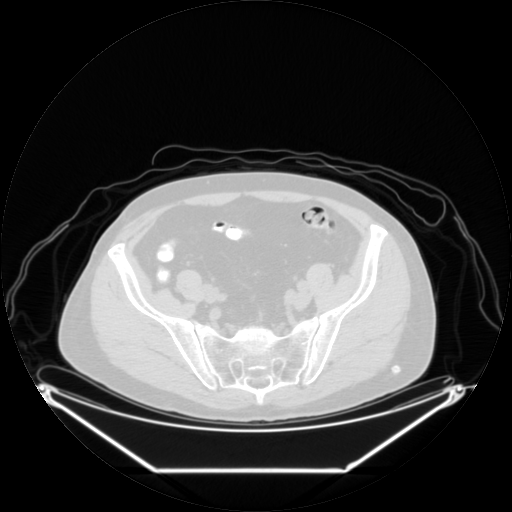
[im 133/265]
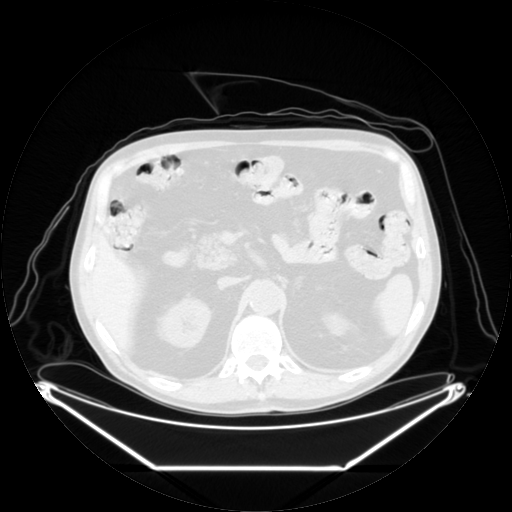
[im 199/265]
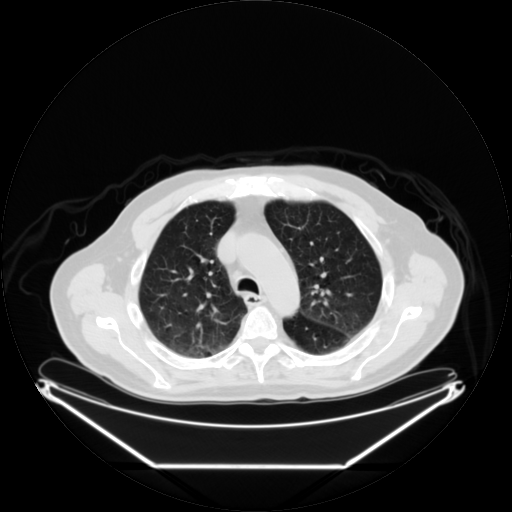
[im 265/265]
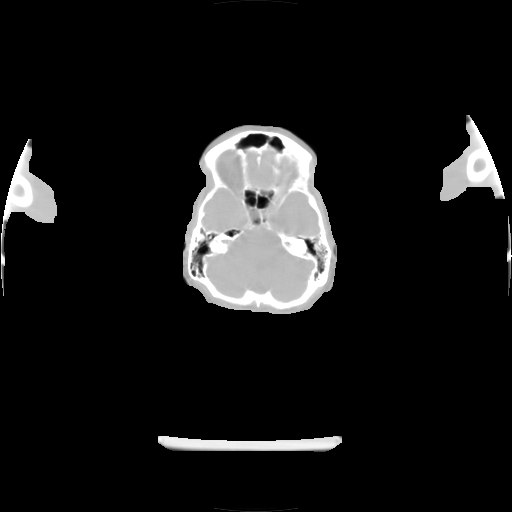

[Series 123: mip · coronal · 3.3mm · 4.69mm/px · 1 of 30 slices shown]
[im 1/30]
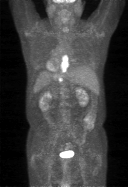

[Series 151: reformatted · axial · 3.3mm · 3.91mm/px · z∈[-870,+0]mm · 6 of 265 slices shown (1 of 2)]
[im 1/265]
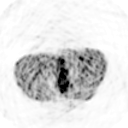
[im 53/265]
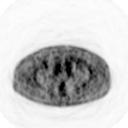
[im 106/265]
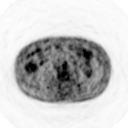
[im 159/265]
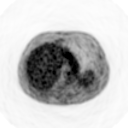
[im 212/265]
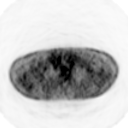
[im 265/265]
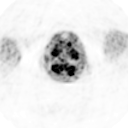

[Series 153: reformatted · coronal · 4.7mm · 6.98mm/px · 2 of 70 slices shown (2 of 2)]
[im 1/70]
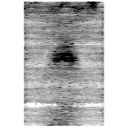
[im 70/70]
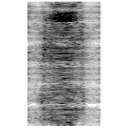

[25 of 25 positions shown; findings below may reference images not displayed]

FINDINGS: Neck:No hypermetabolic nodes in the neck.

Chest:There is a long segment (9 cm) of circumferential
hypermetabolic activity within the distal esophagus extending from
the level of the carina to the GE junction with intense metabolic
activity ( SUV max = 17.9).

There are small paratracheal lymph nodes which are not
hypermetabolic.

Review of the lung windows demonstrates no suspicious pulmonary
nodules.

Abdomen / Pelvis:There is a hypermetabolic gastrohepatic ligament
lymph node which measures 19 mm short axis (image 118) with SUV max
= 12.1.  No additional hypermetabolic abdominal  or retroperitoneal
lymph nodes.
No hypermetabolic lesions within the liver.

Skeleton:Review of  bone windows demonstrates no aggressive osseous
lesions.
IMPRESSION: 1.  Long segment of intense hypermetabolic activity in the distal
esophagus consistent primary esophageal carcinoma.
2.  Local nodal metastasis within a hypermetabolic gastrohepatic
ligament node.
3.  No additional evidence of nodal metastasis or distant
metastasis.

## 2013-10-14 DIAGNOSIS — H26499 Other secondary cataract, unspecified eye: Secondary | ICD-10-CM | POA: Diagnosis not present

## 2013-11-13 DIAGNOSIS — C9140 Hairy cell leukemia not having achieved remission: Secondary | ICD-10-CM | POA: Diagnosis not present

## 2013-11-13 DIAGNOSIS — C159 Malignant neoplasm of esophagus, unspecified: Secondary | ICD-10-CM | POA: Diagnosis not present

## 2013-11-13 DIAGNOSIS — D759 Disease of blood and blood-forming organs, unspecified: Secondary | ICD-10-CM | POA: Diagnosis not present

## 2013-11-13 DIAGNOSIS — Z9889 Other specified postprocedural states: Secondary | ICD-10-CM | POA: Diagnosis not present

## 2013-12-16 DIAGNOSIS — J45902 Unspecified asthma with status asthmaticus: Secondary | ICD-10-CM | POA: Diagnosis not present

## 2013-12-16 DIAGNOSIS — IMO0002 Reserved for concepts with insufficient information to code with codable children: Secondary | ICD-10-CM | POA: Diagnosis not present

## 2013-12-16 DIAGNOSIS — K2100 Gastro-esophageal reflux disease with esophagitis, without bleeding: Secondary | ICD-10-CM | POA: Diagnosis not present

## 2013-12-16 DIAGNOSIS — N400 Enlarged prostate without lower urinary tract symptoms: Secondary | ICD-10-CM | POA: Diagnosis not present

## 2013-12-16 DIAGNOSIS — C159 Malignant neoplasm of esophagus, unspecified: Secondary | ICD-10-CM | POA: Diagnosis not present

## 2013-12-16 DIAGNOSIS — R42 Dizziness and giddiness: Secondary | ICD-10-CM | POA: Diagnosis not present

## 2014-01-01 DIAGNOSIS — Z9889 Other specified postprocedural states: Secondary | ICD-10-CM | POA: Diagnosis not present

## 2014-01-01 DIAGNOSIS — C159 Malignant neoplasm of esophagus, unspecified: Secondary | ICD-10-CM | POA: Diagnosis not present

## 2014-01-01 DIAGNOSIS — C9140 Hairy cell leukemia not having achieved remission: Secondary | ICD-10-CM | POA: Diagnosis not present

## 2014-01-01 DIAGNOSIS — D472 Monoclonal gammopathy: Secondary | ICD-10-CM | POA: Diagnosis not present

## 2014-01-01 DIAGNOSIS — D61818 Other pancytopenia: Secondary | ICD-10-CM | POA: Diagnosis not present

## 2014-02-12 DIAGNOSIS — Z9889 Other specified postprocedural states: Secondary | ICD-10-CM | POA: Diagnosis not present

## 2014-02-27 DIAGNOSIS — T148 Other injury of unspecified body region: Secondary | ICD-10-CM | POA: Diagnosis not present

## 2014-02-27 DIAGNOSIS — L259 Unspecified contact dermatitis, unspecified cause: Secondary | ICD-10-CM | POA: Diagnosis not present

## 2014-02-27 DIAGNOSIS — W57XXXA Bitten or stung by nonvenomous insect and other nonvenomous arthropods, initial encounter: Secondary | ICD-10-CM | POA: Diagnosis not present

## 2014-03-10 DIAGNOSIS — Z9221 Personal history of antineoplastic chemotherapy: Secondary | ICD-10-CM | POA: Diagnosis not present

## 2014-03-10 DIAGNOSIS — Z856 Personal history of leukemia: Secondary | ICD-10-CM | POA: Diagnosis not present

## 2014-03-10 DIAGNOSIS — Z923 Personal history of irradiation: Secondary | ICD-10-CM | POA: Diagnosis not present

## 2014-03-10 DIAGNOSIS — C9140 Hairy cell leukemia not having achieved remission: Secondary | ICD-10-CM | POA: Diagnosis not present

## 2014-03-10 DIAGNOSIS — Z8501 Personal history of malignant neoplasm of esophagus: Secondary | ICD-10-CM | POA: Diagnosis not present

## 2014-03-10 DIAGNOSIS — D61818 Other pancytopenia: Secondary | ICD-10-CM | POA: Diagnosis not present

## 2014-03-25 DIAGNOSIS — Z9889 Other specified postprocedural states: Secondary | ICD-10-CM | POA: Diagnosis not present

## 2014-04-04 DIAGNOSIS — H903 Sensorineural hearing loss, bilateral: Secondary | ICD-10-CM | POA: Diagnosis not present

## 2014-05-01 DIAGNOSIS — Z23 Encounter for immunization: Secondary | ICD-10-CM | POA: Diagnosis not present

## 2014-05-07 DIAGNOSIS — C159 Malignant neoplasm of esophagus, unspecified: Secondary | ICD-10-CM | POA: Diagnosis not present

## 2014-05-07 DIAGNOSIS — Z95828 Presence of other vascular implants and grafts: Secondary | ICD-10-CM | POA: Diagnosis not present

## 2014-05-08 DIAGNOSIS — N401 Enlarged prostate with lower urinary tract symptoms: Secondary | ICD-10-CM | POA: Diagnosis not present

## 2014-05-08 DIAGNOSIS — J45909 Unspecified asthma, uncomplicated: Secondary | ICD-10-CM | POA: Diagnosis not present

## 2014-05-08 DIAGNOSIS — K210 Gastro-esophageal reflux disease with esophagitis: Secondary | ICD-10-CM | POA: Diagnosis not present

## 2014-05-08 DIAGNOSIS — F325 Major depressive disorder, single episode, in full remission: Secondary | ICD-10-CM | POA: Diagnosis not present

## 2014-06-05 DIAGNOSIS — C159 Malignant neoplasm of esophagus, unspecified: Secondary | ICD-10-CM | POA: Diagnosis not present

## 2014-06-18 DIAGNOSIS — Z95828 Presence of other vascular implants and grafts: Secondary | ICD-10-CM | POA: Diagnosis not present

## 2014-07-30 DIAGNOSIS — Z95828 Presence of other vascular implants and grafts: Secondary | ICD-10-CM | POA: Diagnosis not present

## 2014-08-07 ENCOUNTER — Emergency Department (HOSPITAL_COMMUNITY): Payer: Medicare Other

## 2014-08-07 ENCOUNTER — Encounter (HOSPITAL_COMMUNITY): Payer: Self-pay | Admitting: Emergency Medicine

## 2014-08-07 ENCOUNTER — Emergency Department (HOSPITAL_COMMUNITY)
Admission: EM | Admit: 2014-08-07 | Discharge: 2014-08-07 | Disposition: A | Discharge status: Home / Self Care | Payer: Medicare Other | Attending: Emergency Medicine | Admitting: Emergency Medicine

## 2014-08-07 DIAGNOSIS — Z7951 Long term (current) use of inhaled steroids: Secondary | ICD-10-CM | POA: Diagnosis not present

## 2014-08-07 DIAGNOSIS — M25551 Pain in right hip: Secondary | ICD-10-CM | POA: Diagnosis not present

## 2014-08-07 DIAGNOSIS — J45909 Unspecified asthma, uncomplicated: Secondary | ICD-10-CM | POA: Diagnosis not present

## 2014-08-07 DIAGNOSIS — Y998 Other external cause status: Secondary | ICD-10-CM | POA: Diagnosis not present

## 2014-08-07 DIAGNOSIS — Z7982 Long term (current) use of aspirin: Secondary | ICD-10-CM | POA: Diagnosis not present

## 2014-08-07 DIAGNOSIS — Z79899 Other long term (current) drug therapy: Secondary | ICD-10-CM | POA: Diagnosis not present

## 2014-08-07 DIAGNOSIS — W01198A Fall on same level from slipping, tripping and stumbling with subsequent striking against other object, initial encounter: Secondary | ICD-10-CM | POA: Insufficient documentation

## 2014-08-07 DIAGNOSIS — Z856 Personal history of leukemia: Secondary | ICD-10-CM | POA: Insufficient documentation

## 2014-08-07 DIAGNOSIS — Z87891 Personal history of nicotine dependence: Secondary | ICD-10-CM | POA: Diagnosis not present

## 2014-08-07 DIAGNOSIS — S51012A Laceration without foreign body of left elbow, initial encounter: Secondary | ICD-10-CM

## 2014-08-07 DIAGNOSIS — W108XXA Fall (on) (from) other stairs and steps, initial encounter: Secondary | ICD-10-CM | POA: Insufficient documentation

## 2014-08-07 DIAGNOSIS — Y92015 Private garage of single-family (private) house as the place of occurrence of the external cause: Secondary | ICD-10-CM | POA: Diagnosis not present

## 2014-08-07 DIAGNOSIS — S79911A Unspecified injury of right hip, initial encounter: Secondary | ICD-10-CM | POA: Diagnosis not present

## 2014-08-07 DIAGNOSIS — Y9389 Activity, other specified: Secondary | ICD-10-CM | POA: Diagnosis not present

## 2014-08-07 DIAGNOSIS — S7001XA Contusion of right hip, initial encounter: Secondary | ICD-10-CM

## 2014-08-07 DIAGNOSIS — S59902A Unspecified injury of left elbow, initial encounter: Secondary | ICD-10-CM | POA: Diagnosis present

## 2014-08-07 DIAGNOSIS — R52 Pain, unspecified: Secondary | ICD-10-CM

## 2014-08-07 HISTORY — DX: Leukemia, unspecified not having achieved remission: C95.90

## 2014-08-07 HISTORY — DX: Unspecified asthma, uncomplicated: J45.909

## 2014-08-07 LAB — CBC WITH DIFFERENTIAL/PLATELET
BASOS ABS: 0 10*3/uL (ref 0.0–0.1)
BASOS PCT: 0 % (ref 0–1)
EOS ABS: 0 10*3/uL (ref 0.0–0.7)
EOS PCT: 1 % (ref 0–5)
HEMATOCRIT: 33.1 % — AB (ref 39.0–52.0)
Hemoglobin: 10.7 g/dL — ABNORMAL LOW (ref 13.0–17.0)
LYMPHS ABS: 0.4 10*3/uL — AB (ref 0.7–4.0)
Lymphocytes Relative: 8 % — ABNORMAL LOW (ref 12–46)
MCH: 34.9 pg — ABNORMAL HIGH (ref 26.0–34.0)
MCHC: 32.3 g/dL (ref 30.0–36.0)
MCV: 107.8 fL — ABNORMAL HIGH (ref 78.0–100.0)
Monocytes Absolute: 0.3 10*3/uL (ref 0.1–1.0)
Monocytes Relative: 5 % (ref 3–12)
NEUTROS ABS: 4.7 10*3/uL (ref 1.7–7.7)
Neutrophils Relative %: 86 % — ABNORMAL HIGH (ref 43–77)
PLATELETS: 86 10*3/uL — AB (ref 150–400)
RBC: 3.07 MIL/uL — AB (ref 4.22–5.81)
RDW: 13.9 % (ref 11.5–15.5)
WBC: 5.4 10*3/uL (ref 4.0–10.5)

## 2014-08-07 LAB — ABO/RH: ABO/RH(D): A POS

## 2014-08-07 LAB — BASIC METABOLIC PANEL
ANION GAP: 6 (ref 5–15)
BUN: 25 mg/dL — ABNORMAL HIGH (ref 6–23)
CALCIUM: 8.9 mg/dL (ref 8.4–10.5)
CO2: 24 mmol/L (ref 19–32)
CREATININE: 1.18 mg/dL (ref 0.50–1.35)
Chloride: 107 mEq/L (ref 96–112)
GFR calc non Af Amer: 55 mL/min — ABNORMAL LOW (ref >90)
GFR, EST AFRICAN AMERICAN: 64 mL/min — AB (ref >90)
GLUCOSE: 97 mg/dL (ref 70–99)
Potassium: 4.1 mmol/L (ref 3.5–5.1)
SODIUM: 137 mmol/L (ref 135–145)

## 2014-08-07 LAB — PROTIME-INR
INR: 1.14 (ref 0.00–1.49)
PROTHROMBIN TIME: 14.7 s (ref 11.6–15.2)

## 2014-08-07 LAB — TYPE AND SCREEN
ABO/RH(D): A POS
ANTIBODY SCREEN: NEGATIVE

## 2014-08-07 MED ORDER — FENTANYL CITRATE 0.05 MG/ML IJ SOLN
50.0000 ug | INTRAMUSCULAR | Status: DC | PRN
  Administered 2014-08-07: 50 ug via INTRAVENOUS
  Filled 2014-08-07: qty 2

## 2014-08-07 MED ORDER — HYDROCODONE-ACETAMINOPHEN 5-325 MG PO TABS: 1.0000 | ORAL_TABLET | ORAL | Status: DC | PRN

## 2014-08-07 MED ORDER — BACITRACIN ZINC 500 UNIT/GM EX OINT
1.0000 "application " | TOPICAL_OINTMENT | Freq: Once | CUTANEOUS | Status: AC
  Administered 2014-08-07: 1 via TOPICAL
  Filled 2014-08-07: qty 0.9

## 2014-08-07 NOTE — ED Notes (Signed)
Pt. Able to ambulate within room and out in the hallway,  Initially limping on the right leg and claimed of pain at 3/10  , able to ambulate outside room with tolarateble pain , steady gait, no s/s  Of distress noted.
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
## 2014-08-07 NOTE — ED Notes (Signed)
MD Tomi Bamberger at bedside

## 2014-08-07 NOTE — ED Notes (Signed)
Attempted to ambulate pt. , pt. Claimed of pain at 10/10 upon standing up, requested this time for the PAIN MED . Will be medicated and will try to ambulate pt. After medicated. To notify MD.

## 2014-08-07 NOTE — ED Notes (Signed)
Patient transported to X-ray 

## 2014-08-07 NOTE — ED Notes (Signed)
Pt. Ready for discharge , awaiting for daughter to pick him up.

## 2014-08-07 NOTE — ED Provider Notes (Signed)
CSN: [id number]     Arrival date & time 08/07/14  1519 History   First MD Initiated Contact with Patient 08/07/14 1528     Chief Complaint  Patient presents with  . Fall  . Hip Pain   HPI Pt was walking down a 2 steps into his garage and missed a step and fell.  He landed on his right hip onto a concrete floor.  He also hit his L elbow. He was able to crouch and get back up into his work shop but had a lot of pain.  He has not been able to walk really since that time.  He called ems and was brought to the ED.   When he is sitting, the pain is not bad at all.  No LOC. Past Medical History  Diagnosis Date  . Asthma   . Leukemia    Past Surgical History  Procedure Laterality Date  . Appendectomy    . Tonsillectomy     No family history on file. History  Substance Use Topics  . Smoking status: Former Smoker    Types: Cigarettes  . Smokeless tobacco: Not on file  . Alcohol Use: Yes     Comment: social    Review of Systems  Respiratory: Negative for choking.   Cardiovascular: Negative for chest pain.  Gastrointestinal: Negative for abdominal pain.  Genitourinary: Negative for hematuria.  Neurological: Negative for headaches.  All other systems reviewed and are negative.     Allergies  Review of patient's allergies indicates no known allergies.  Home Medications   Prior to Admission medications   Medication Sig Start Date End Date Taking? Authorizing Provider  aspirin EC 81 MG tablet Take 81 mg by mouth daily.     Yes Historical Provider, MD  Cholecalciferol (VITAMIN D) 2000 UNITS CAPS Take 2,000 Units by mouth daily.     Yes Historical Provider, MD  dutasteride (AVODART) 0.5 MG capsule Take 0.5 mg by mouth daily.     Yes Historical Provider, MD  Fluticasone-Salmeterol (ADVAIR) 100-50 MCG/DOSE AEPB Inhale 1 puff into the lungs 2 (two) times daily.     Yes Historical Provider, MD  methocarbamol (ROBAXIN) 500 MG tablet Take 500 mg by mouth 2 (two) times daily.   Yes  Historical Provider, MD  montelukast (SINGULAIR) 10 MG tablet Take 10 mg by mouth daily.     Yes Historical Provider, MD  pantoprazole (PROTONIX) 40 MG tablet Take 40 mg by mouth every other day.     Yes Historical Provider, MD  Tamsulosin HCl (FLOMAX) 0.4 MG CAPS Take 0.4 mg by mouth daily.     Yes Historical Provider, MD   BP 113/72 mmHg  Pulse 74  Temp(Src) 98.9 F (37.2 C) (Oral)  Resp 18  SpO2 94% Physical Exam  Constitutional: He appears well-developed and well-nourished. No distress.  HENT:  Head: Normocephalic and atraumatic.  Right Ear: External ear normal.  Left Ear: External ear normal.  Eyes: Conjunctivae are normal. Right eye exhibits no discharge. Left eye exhibits no discharge. No scleral icterus.  Neck: Neck supple. No tracheal deviation present.  Cardiovascular: Normal rate, regular rhythm and intact distal pulses.   Pulmonary/Chest: Effort normal and breath sounds normal. No stridor. No respiratory distress. He has no wheezes. He has no rales.  Abdominal: Soft. Bowel sounds are normal. He exhibits no distension. There is no tenderness. There is no rebound and no guarding.  Musculoskeletal: He exhibits no edema.       Right wrist:  Normal.       Left wrist: Normal.       Right hip: He exhibits tenderness. He exhibits no swelling and no deformity.       Left hip: Normal.       Cervical back: Normal.  Position of comfort is with knee flexed and hip externally rotated, small superificial skin tear left elbow, full rom, no ttp  Neurological: He is alert. He has normal strength. No cranial nerve deficit (no facial droop, extraocular movements intact, no slurred speech) or sensory deficit. He exhibits normal muscle tone. He displays no seizure activity. Coordination normal.  Skin: Skin is warm and dry. No rash noted.  Psychiatric: He has a normal mood and affect.  Nursing note and vitals reviewed.   ED Course  Procedures (including critical care time) Labs Review Labs  Reviewed  BASIC METABOLIC PANEL - Abnormal; Notable for the following:    BUN 25 (*)    GFR calc non Af Amer 55 (*)    GFR calc Af Amer 64 (*)    All other components within normal limits  CBC WITH DIFFERENTIAL - Abnormal; Notable for the following:    RBC 3.07 (*)    Hemoglobin 10.7 (*)    HCT 33.1 (*)    MCV 107.8 (*)    MCH 34.9 (*)    Platelets 86 (*)    Neutrophils Relative % 86 (*)    Lymphocytes Relative 8 (*)    Lymphs Abs 0.4 (*)    All other components within normal limits  PROTIME-INR  TYPE AND SCREEN  ABO/RH    Imaging Review Ct Hip Right Wo Contrast  08/07/2014   CLINICAL DATA:  Fall today. Unable to bear weight on right hip. Negative radiographs.  EXAM: CT OF THE RIGHT HIP WITHOUT CONTRAST  TECHNIQUE: Multidetector CT imaging of the right hip was performed according to the standard protocol. Multiplanar CT image reconstructions were also generated.  COMPARISON:  Current right hip radiographs  FINDINGS: No fracture. Hip joint is normally aligned. No significant arthropathic change. No joint effusion.  5.8 cm Lipoma lies in the anterior musculature. No evidence of a muscle strain or hematoma.  IMPRESSION: No fracture.  No significant joint abnormality.   Electronically Signed   By: Lajean Manes M.D.   On: 08/07/2014 18:35   Dg Hip Unilat With Pelvis 2-3 Views Right  08/07/2014   CLINICAL DATA:  Tripped over stepped with fall with right hip pain, initial encounter  EXAM: DG HIP W/ PELVIS 2-3V*R*  COMPARISON:  None.  FINDINGS: Pelvic ring is intact. No fracture or dislocation is seen. No gross soft tissue abnormality is noted.  IMPRESSION: No acute abnormality noted.   Electronically Signed   By: Inez Catalina M.D.   On: 08/07/2014 16:32     MDM   Final diagnoses:  Pain  Contusion, hip, right, initial encounter  Elbow laceration, left, initial encounter    Initial plain films negative.  Pt has not been able to walk.  Will ct hip.  Pt has a portacath in right chest.   Does not believe he can get an MRI.  CT scan negative.  Pt's pain is improving.  Will make sure he can walk.  Elbow laceration is just a small superificial skin tear.  Local wound care in the ED.      Dorie Rank, MD 08/07/14 [phone]

## 2014-08-07 NOTE — Progress Notes (Signed)
CSW met with patient at bedside. There was no family present. Patient confirms that he presents to the ED today today because of fall. According to patient, he missed a 2nd step while in his garage at home today, which caused the fall. Patient states that prior to coming into the ED he has been able to complete his ADL's independently. Patient says he lives alone but his daughter usually comes and stays with him for half of the week. Patient informed CSW that he lives in [place], [place].   Patient states that he feels a lot better than when initially came into the ED.  Willette Brace [phone] ED CSW 08/07/2014 11:26 PM'

## 2014-08-07 NOTE — ED Notes (Addendum)
Pt from home via [facility] c/o right hip pain from a fall in which he tripped over a step. NO LOC and no blood thinners. NO shortening or rotation. Pt feels more comfortable with knee bent upward. Skin tear to left elbow, bleeding controlled.

## 2014-09-03 DIAGNOSIS — C159 Malignant neoplasm of esophagus, unspecified: Secondary | ICD-10-CM | POA: Diagnosis not present

## 2014-09-03 DIAGNOSIS — C9140 Hairy cell leukemia not having achieved remission: Secondary | ICD-10-CM | POA: Diagnosis not present

## 2014-09-03 DIAGNOSIS — D519 Vitamin B12 deficiency anemia, unspecified: Secondary | ICD-10-CM | POA: Diagnosis not present

## 2014-09-03 DIAGNOSIS — D696 Thrombocytopenia, unspecified: Secondary | ICD-10-CM | POA: Diagnosis not present

## 2014-09-03 DIAGNOSIS — D759 Disease of blood and blood-forming organs, unspecified: Secondary | ICD-10-CM | POA: Diagnosis not present

## 2014-09-08 DIAGNOSIS — Z8501 Personal history of malignant neoplasm of esophagus: Secondary | ICD-10-CM | POA: Diagnosis not present

## 2014-09-08 DIAGNOSIS — Z91018 Allergy to other foods: Secondary | ICD-10-CM | POA: Diagnosis not present

## 2014-09-08 DIAGNOSIS — Z889 Allergy status to unspecified drugs, medicaments and biological substances status: Secondary | ICD-10-CM | POA: Diagnosis not present

## 2014-09-08 DIAGNOSIS — C159 Malignant neoplasm of esophagus, unspecified: Secondary | ICD-10-CM | POA: Diagnosis not present

## 2014-09-08 DIAGNOSIS — D696 Thrombocytopenia, unspecified: Secondary | ICD-10-CM | POA: Diagnosis not present

## 2014-09-08 DIAGNOSIS — D61818 Other pancytopenia: Secondary | ICD-10-CM | POA: Diagnosis not present

## 2014-09-08 DIAGNOSIS — D759 Disease of blood and blood-forming organs, unspecified: Secondary | ICD-10-CM | POA: Diagnosis not present

## 2014-09-08 DIAGNOSIS — D519 Vitamin B12 deficiency anemia, unspecified: Secondary | ICD-10-CM | POA: Diagnosis not present

## 2014-09-08 DIAGNOSIS — C9140 Hairy cell leukemia not having achieved remission: Secondary | ICD-10-CM | POA: Diagnosis not present

## 2014-09-10 DIAGNOSIS — Z95828 Presence of other vascular implants and grafts: Secondary | ICD-10-CM | POA: Diagnosis not present

## 2014-09-15 DIAGNOSIS — R600 Localized edema: Secondary | ICD-10-CM | POA: Diagnosis not present

## 2014-09-16 DIAGNOSIS — R600 Localized edema: Secondary | ICD-10-CM | POA: Diagnosis not present

## 2014-09-16 DIAGNOSIS — M79604 Pain in right leg: Secondary | ICD-10-CM | POA: Diagnosis not present

## 2014-09-16 DIAGNOSIS — M7989 Other specified soft tissue disorders: Secondary | ICD-10-CM | POA: Diagnosis not present

## 2014-09-24 DIAGNOSIS — C9140 Hairy cell leukemia not having achieved remission: Secondary | ICD-10-CM | POA: Diagnosis not present

## 2014-09-24 DIAGNOSIS — D638 Anemia in other chronic diseases classified elsewhere: Secondary | ICD-10-CM | POA: Diagnosis not present

## 2014-09-24 DIAGNOSIS — C159 Malignant neoplasm of esophagus, unspecified: Secondary | ICD-10-CM | POA: Diagnosis not present

## 2014-09-24 DIAGNOSIS — D696 Thrombocytopenia, unspecified: Secondary | ICD-10-CM | POA: Diagnosis not present

## 2014-10-06 DIAGNOSIS — Z1389 Encounter for screening for other disorder: Secondary | ICD-10-CM | POA: Diagnosis not present

## 2014-10-06 DIAGNOSIS — K210 Gastro-esophageal reflux disease with esophagitis: Secondary | ICD-10-CM | POA: Diagnosis not present

## 2014-10-06 DIAGNOSIS — J45909 Unspecified asthma, uncomplicated: Secondary | ICD-10-CM | POA: Diagnosis not present

## 2014-10-06 DIAGNOSIS — N401 Enlarged prostate with lower urinary tract symptoms: Secondary | ICD-10-CM | POA: Diagnosis not present

## 2014-10-06 DIAGNOSIS — F325 Major depressive disorder, single episode, in full remission: Secondary | ICD-10-CM | POA: Diagnosis not present

## 2014-10-22 DIAGNOSIS — Z95828 Presence of other vascular implants and grafts: Secondary | ICD-10-CM | POA: Diagnosis not present

## 2014-12-03 DIAGNOSIS — Z95828 Presence of other vascular implants and grafts: Secondary | ICD-10-CM | POA: Diagnosis not present

## 2014-12-22 DIAGNOSIS — Z961 Presence of intraocular lens: Secondary | ICD-10-CM | POA: Diagnosis not present

## 2014-12-25 ENCOUNTER — Other Ambulatory Visit (HOSPITAL_COMMUNITY): Payer: Self-pay | Admitting: Internal Medicine

## 2014-12-25 DIAGNOSIS — C9141 Hairy cell leukemia, in remission: Secondary | ICD-10-CM | POA: Diagnosis not present

## 2014-12-25 DIAGNOSIS — D638 Anemia in other chronic diseases classified elsewhere: Secondary | ICD-10-CM | POA: Diagnosis not present

## 2014-12-25 DIAGNOSIS — Z95828 Presence of other vascular implants and grafts: Secondary | ICD-10-CM | POA: Diagnosis not present

## 2014-12-25 DIAGNOSIS — C159 Malignant neoplasm of esophagus, unspecified: Secondary | ICD-10-CM

## 2014-12-25 DIAGNOSIS — D61818 Other pancytopenia: Secondary | ICD-10-CM | POA: Diagnosis not present

## 2014-12-25 DIAGNOSIS — C9140 Hairy cell leukemia not having achieved remission: Secondary | ICD-10-CM | POA: Diagnosis not present

## 2014-12-25 DIAGNOSIS — D472 Monoclonal gammopathy: Secondary | ICD-10-CM | POA: Diagnosis not present

## 2014-12-25 DIAGNOSIS — K219 Gastro-esophageal reflux disease without esophagitis: Secondary | ICD-10-CM | POA: Diagnosis not present

## 2015-01-14 DIAGNOSIS — Z95828 Presence of other vascular implants and grafts: Secondary | ICD-10-CM | POA: Diagnosis not present

## 2015-01-14 DIAGNOSIS — C9140 Hairy cell leukemia not having achieved remission: Secondary | ICD-10-CM | POA: Diagnosis not present

## 2015-01-28 DIAGNOSIS — F325 Major depressive disorder, single episode, in full remission: Secondary | ICD-10-CM | POA: Diagnosis not present

## 2015-01-28 DIAGNOSIS — K210 Gastro-esophageal reflux disease with esophagitis: Secondary | ICD-10-CM | POA: Diagnosis not present

## 2015-01-28 DIAGNOSIS — N401 Enlarged prostate with lower urinary tract symptoms: Secondary | ICD-10-CM | POA: Diagnosis not present

## 2015-01-28 DIAGNOSIS — J45909 Unspecified asthma, uncomplicated: Secondary | ICD-10-CM | POA: Diagnosis not present

## 2015-02-18 ENCOUNTER — Ambulatory Visit (HOSPITAL_COMMUNITY)
Admission: RE | Admit: 2015-02-18 | Discharge: 2015-02-18 | Disposition: A | Discharge status: Home / Self Care | Payer: Medicare Other | Source: Ambulatory Visit | Attending: Internal Medicine | Admitting: Internal Medicine

## 2015-02-18 DIAGNOSIS — D179 Benign lipomatous neoplasm, unspecified: Secondary | ICD-10-CM | POA: Diagnosis not present

## 2015-02-18 DIAGNOSIS — J439 Emphysema, unspecified: Secondary | ICD-10-CM | POA: Diagnosis not present

## 2015-02-18 DIAGNOSIS — Z79899 Other long term (current) drug therapy: Secondary | ICD-10-CM | POA: Diagnosis not present

## 2015-02-18 DIAGNOSIS — N289 Disorder of kidney and ureter, unspecified: Secondary | ICD-10-CM | POA: Insufficient documentation

## 2015-02-18 DIAGNOSIS — I700 Atherosclerosis of aorta: Secondary | ICD-10-CM | POA: Insufficient documentation

## 2015-02-18 DIAGNOSIS — R918 Other nonspecific abnormal finding of lung field: Secondary | ICD-10-CM | POA: Diagnosis not present

## 2015-02-18 DIAGNOSIS — K4020 Bilateral inguinal hernia, without obstruction or gangrene, not specified as recurrent: Secondary | ICD-10-CM | POA: Diagnosis not present

## 2015-02-18 DIAGNOSIS — C159 Malignant neoplasm of esophagus, unspecified: Secondary | ICD-10-CM | POA: Diagnosis not present

## 2015-02-18 DIAGNOSIS — K5730 Diverticulosis of large intestine without perforation or abscess without bleeding: Secondary | ICD-10-CM | POA: Insufficient documentation

## 2015-02-18 LAB — GLUCOSE, CAPILLARY: GLUCOSE-CAPILLARY: 101 mg/dL — AB (ref 65–99)

## 2015-02-18 MED ORDER — FLUDEOXYGLUCOSE F - 18 (FDG) INJECTION
9.2100 | Freq: Once | INTRAVENOUS | Status: AC | PRN
  Administered 2015-02-18: 9.21 via INTRAVENOUS

## 2015-02-25 DIAGNOSIS — M1990 Unspecified osteoarthritis, unspecified site: Secondary | ICD-10-CM | POA: Diagnosis not present

## 2015-02-25 DIAGNOSIS — Z9221 Personal history of antineoplastic chemotherapy: Secondary | ICD-10-CM | POA: Diagnosis not present

## 2015-02-25 DIAGNOSIS — C159 Malignant neoplasm of esophagus, unspecified: Secondary | ICD-10-CM | POA: Diagnosis not present

## 2015-02-25 DIAGNOSIS — D638 Anemia in other chronic diseases classified elsewhere: Secondary | ICD-10-CM | POA: Diagnosis not present

## 2015-02-25 DIAGNOSIS — C9140 Hairy cell leukemia not having achieved remission: Secondary | ICD-10-CM | POA: Diagnosis not present

## 2015-02-25 DIAGNOSIS — D472 Monoclonal gammopathy: Secondary | ICD-10-CM | POA: Diagnosis not present

## 2015-02-25 DIAGNOSIS — Z95828 Presence of other vascular implants and grafts: Secondary | ICD-10-CM | POA: Diagnosis not present

## 2015-03-09 DIAGNOSIS — Z856 Personal history of leukemia: Secondary | ICD-10-CM | POA: Diagnosis not present

## 2015-03-09 DIAGNOSIS — C9140 Hairy cell leukemia not having achieved remission: Secondary | ICD-10-CM | POA: Diagnosis not present

## 2015-03-09 DIAGNOSIS — D61818 Other pancytopenia: Secondary | ICD-10-CM | POA: Diagnosis not present

## 2015-03-09 DIAGNOSIS — Z8501 Personal history of malignant neoplasm of esophagus: Secondary | ICD-10-CM | POA: Diagnosis not present

## 2015-03-09 DIAGNOSIS — M1990 Unspecified osteoarthritis, unspecified site: Secondary | ICD-10-CM | POA: Diagnosis not present

## 2015-03-11 DIAGNOSIS — D531 Other megaloblastic anemias, not elsewhere classified: Secondary | ICD-10-CM | POA: Diagnosis not present

## 2015-03-11 DIAGNOSIS — M160 Bilateral primary osteoarthritis of hip: Secondary | ICD-10-CM | POA: Diagnosis not present

## 2015-04-08 DIAGNOSIS — N400 Enlarged prostate without lower urinary tract symptoms: Secondary | ICD-10-CM | POA: Diagnosis not present

## 2015-04-08 DIAGNOSIS — Z95828 Presence of other vascular implants and grafts: Secondary | ICD-10-CM | POA: Diagnosis not present

## 2015-04-22 DIAGNOSIS — M160 Bilateral primary osteoarthritis of hip: Secondary | ICD-10-CM | POA: Diagnosis not present

## 2015-04-22 DIAGNOSIS — D531 Other megaloblastic anemias, not elsewhere classified: Secondary | ICD-10-CM | POA: Diagnosis not present

## 2015-04-22 DIAGNOSIS — Z23 Encounter for immunization: Secondary | ICD-10-CM | POA: Diagnosis not present

## 2015-05-04 DIAGNOSIS — D61818 Other pancytopenia: Secondary | ICD-10-CM | POA: Diagnosis not present

## 2015-05-04 DIAGNOSIS — C9140 Hairy cell leukemia not having achieved remission: Secondary | ICD-10-CM | POA: Diagnosis not present

## 2015-05-04 DIAGNOSIS — Z95828 Presence of other vascular implants and grafts: Secondary | ICD-10-CM | POA: Diagnosis not present

## 2015-05-04 DIAGNOSIS — Z7982 Long term (current) use of aspirin: Secondary | ICD-10-CM | POA: Diagnosis not present

## 2015-05-04 DIAGNOSIS — C9141 Hairy cell leukemia, in remission: Secondary | ICD-10-CM | POA: Diagnosis not present

## 2015-05-20 DIAGNOSIS — Z95828 Presence of other vascular implants and grafts: Secondary | ICD-10-CM | POA: Diagnosis not present

## 2015-05-20 DIAGNOSIS — D472 Monoclonal gammopathy: Secondary | ICD-10-CM | POA: Diagnosis not present

## 2015-05-26 DIAGNOSIS — D696 Thrombocytopenia, unspecified: Secondary | ICD-10-CM | POA: Diagnosis not present

## 2015-05-26 DIAGNOSIS — K210 Gastro-esophageal reflux disease with esophagitis: Secondary | ICD-10-CM | POA: Diagnosis not present

## 2015-05-26 DIAGNOSIS — J45909 Unspecified asthma, uncomplicated: Secondary | ICD-10-CM | POA: Diagnosis not present

## 2015-05-26 DIAGNOSIS — M160 Bilateral primary osteoarthritis of hip: Secondary | ICD-10-CM | POA: Diagnosis not present

## 2015-05-26 DIAGNOSIS — F325 Major depressive disorder, single episode, in full remission: Secondary | ICD-10-CM | POA: Diagnosis not present

## 2015-05-26 DIAGNOSIS — C9142 Hairy cell leukemia, in relapse: Secondary | ICD-10-CM | POA: Diagnosis not present

## 2015-05-26 DIAGNOSIS — N401 Enlarged prostate with lower urinary tract symptoms: Secondary | ICD-10-CM | POA: Diagnosis not present

## 2015-05-26 DIAGNOSIS — D649 Anemia, unspecified: Secondary | ICD-10-CM | POA: Diagnosis not present

## 2015-07-01 DIAGNOSIS — Z95828 Presence of other vascular implants and grafts: Secondary | ICD-10-CM | POA: Diagnosis not present

## 2015-07-15 DIAGNOSIS — C159 Malignant neoplasm of esophagus, unspecified: Secondary | ICD-10-CM | POA: Diagnosis not present

## 2015-07-15 DIAGNOSIS — K22710 Barrett's esophagus with low grade dysplasia: Secondary | ICD-10-CM | POA: Diagnosis not present

## 2015-07-17 DIAGNOSIS — Z7982 Long term (current) use of aspirin: Secondary | ICD-10-CM | POA: Diagnosis not present

## 2015-07-17 DIAGNOSIS — C159 Malignant neoplasm of esophagus, unspecified: Secondary | ICD-10-CM | POA: Diagnosis not present

## 2015-07-17 DIAGNOSIS — Z856 Personal history of leukemia: Secondary | ICD-10-CM | POA: Diagnosis not present

## 2015-07-17 DIAGNOSIS — Z7951 Long term (current) use of inhaled steroids: Secondary | ICD-10-CM | POA: Diagnosis not present

## 2015-07-17 DIAGNOSIS — Z808 Family history of malignant neoplasm of other organs or systems: Secondary | ICD-10-CM | POA: Diagnosis not present

## 2015-07-17 DIAGNOSIS — K22710 Barrett's esophagus with low grade dysplasia: Secondary | ICD-10-CM | POA: Diagnosis not present

## 2015-07-17 DIAGNOSIS — Z9221 Personal history of antineoplastic chemotherapy: Secondary | ICD-10-CM | POA: Diagnosis not present

## 2015-07-17 DIAGNOSIS — Z8501 Personal history of malignant neoplasm of esophagus: Secondary | ICD-10-CM | POA: Diagnosis not present

## 2015-07-17 DIAGNOSIS — F17210 Nicotine dependence, cigarettes, uncomplicated: Secondary | ICD-10-CM | POA: Diagnosis not present

## 2015-07-17 DIAGNOSIS — K2270 Barrett's esophagus without dysplasia: Secondary | ICD-10-CM | POA: Diagnosis not present

## 2015-07-17 DIAGNOSIS — Z923 Personal history of irradiation: Secondary | ICD-10-CM | POA: Diagnosis not present

## 2015-07-17 DIAGNOSIS — Z9049 Acquired absence of other specified parts of digestive tract: Secondary | ICD-10-CM | POA: Diagnosis not present

## 2015-07-17 DIAGNOSIS — Z8249 Family history of ischemic heart disease and other diseases of the circulatory system: Secondary | ICD-10-CM | POA: Diagnosis not present

## 2015-07-17 DIAGNOSIS — Z79899 Other long term (current) drug therapy: Secondary | ICD-10-CM | POA: Diagnosis not present

## 2015-07-17 DIAGNOSIS — F329 Major depressive disorder, single episode, unspecified: Secondary | ICD-10-CM | POA: Diagnosis not present

## 2015-07-17 DIAGNOSIS — J45909 Unspecified asthma, uncomplicated: Secondary | ICD-10-CM | POA: Diagnosis not present

## 2015-07-17 DIAGNOSIS — K219 Gastro-esophageal reflux disease without esophagitis: Secondary | ICD-10-CM | POA: Diagnosis not present

## 2015-07-17 DIAGNOSIS — N400 Enlarged prostate without lower urinary tract symptoms: Secondary | ICD-10-CM | POA: Diagnosis not present

## 2015-07-17 DIAGNOSIS — K449 Diaphragmatic hernia without obstruction or gangrene: Secondary | ICD-10-CM | POA: Diagnosis not present

## 2015-07-22 DIAGNOSIS — R05 Cough: Secondary | ICD-10-CM | POA: Diagnosis not present

## 2015-07-22 DIAGNOSIS — J069 Acute upper respiratory infection, unspecified: Secondary | ICD-10-CM | POA: Diagnosis not present

## 2015-08-10 DIAGNOSIS — D649 Anemia, unspecified: Secondary | ICD-10-CM | POA: Diagnosis not present

## 2015-08-10 DIAGNOSIS — Z95828 Presence of other vascular implants and grafts: Secondary | ICD-10-CM | POA: Diagnosis not present

## 2015-08-10 DIAGNOSIS — Z8501 Personal history of malignant neoplasm of esophagus: Secondary | ICD-10-CM | POA: Diagnosis not present

## 2015-08-10 DIAGNOSIS — C9141 Hairy cell leukemia, in remission: Secondary | ICD-10-CM | POA: Diagnosis not present

## 2015-08-10 DIAGNOSIS — D696 Thrombocytopenia, unspecified: Secondary | ICD-10-CM | POA: Diagnosis not present

## 2015-08-10 DIAGNOSIS — C9140 Hairy cell leukemia not having achieved remission: Secondary | ICD-10-CM | POA: Diagnosis not present

## 2015-08-28 DIAGNOSIS — M1990 Unspecified osteoarthritis, unspecified site: Secondary | ICD-10-CM | POA: Diagnosis not present

## 2015-08-28 DIAGNOSIS — C159 Malignant neoplasm of esophagus, unspecified: Secondary | ICD-10-CM | POA: Diagnosis not present

## 2015-08-28 DIAGNOSIS — C9141 Hairy cell leukemia, in remission: Secondary | ICD-10-CM | POA: Diagnosis not present

## 2015-08-28 DIAGNOSIS — Z95828 Presence of other vascular implants and grafts: Secondary | ICD-10-CM | POA: Diagnosis not present

## 2015-08-28 DIAGNOSIS — Z9221 Personal history of antineoplastic chemotherapy: Secondary | ICD-10-CM | POA: Diagnosis not present

## 2015-08-28 DIAGNOSIS — D696 Thrombocytopenia, unspecified: Secondary | ICD-10-CM | POA: Diagnosis not present

## 2015-09-17 DIAGNOSIS — D696 Thrombocytopenia, unspecified: Secondary | ICD-10-CM | POA: Diagnosis not present

## 2015-09-17 DIAGNOSIS — C9142 Hairy cell leukemia, in relapse: Secondary | ICD-10-CM | POA: Diagnosis not present

## 2015-09-17 DIAGNOSIS — N401 Enlarged prostate with lower urinary tract symptoms: Secondary | ICD-10-CM | POA: Diagnosis not present

## 2015-09-17 DIAGNOSIS — F325 Major depressive disorder, single episode, in full remission: Secondary | ICD-10-CM | POA: Diagnosis not present

## 2015-09-17 DIAGNOSIS — K210 Gastro-esophageal reflux disease with esophagitis: Secondary | ICD-10-CM | POA: Diagnosis not present

## 2015-09-17 DIAGNOSIS — J45909 Unspecified asthma, uncomplicated: Secondary | ICD-10-CM | POA: Diagnosis not present

## 2015-09-17 DIAGNOSIS — M160 Bilateral primary osteoarthritis of hip: Secondary | ICD-10-CM | POA: Diagnosis not present

## 2015-09-17 DIAGNOSIS — D649 Anemia, unspecified: Secondary | ICD-10-CM | POA: Diagnosis not present

## 2015-10-09 DIAGNOSIS — Z9221 Personal history of antineoplastic chemotherapy: Secondary | ICD-10-CM | POA: Diagnosis not present

## 2015-10-09 DIAGNOSIS — D472 Monoclonal gammopathy: Secondary | ICD-10-CM | POA: Diagnosis not present

## 2015-10-09 DIAGNOSIS — C9141 Hairy cell leukemia, in remission: Secondary | ICD-10-CM | POA: Diagnosis not present

## 2015-10-09 DIAGNOSIS — Z95828 Presence of other vascular implants and grafts: Secondary | ICD-10-CM | POA: Diagnosis not present

## 2015-11-06 DIAGNOSIS — R042 Hemoptysis: Secondary | ICD-10-CM | POA: Diagnosis not present

## 2015-11-09 DIAGNOSIS — D696 Thrombocytopenia, unspecified: Secondary | ICD-10-CM | POA: Diagnosis not present

## 2015-11-09 DIAGNOSIS — C159 Malignant neoplasm of esophagus, unspecified: Secondary | ICD-10-CM | POA: Diagnosis not present

## 2015-11-09 DIAGNOSIS — C9141 Hairy cell leukemia, in remission: Secondary | ICD-10-CM | POA: Diagnosis not present

## 2015-11-09 DIAGNOSIS — Z7982 Long term (current) use of aspirin: Secondary | ICD-10-CM | POA: Diagnosis not present

## 2015-11-09 DIAGNOSIS — D649 Anemia, unspecified: Secondary | ICD-10-CM | POA: Diagnosis not present

## 2015-11-20 DIAGNOSIS — Z95828 Presence of other vascular implants and grafts: Secondary | ICD-10-CM | POA: Diagnosis not present

## 2016-01-04 DIAGNOSIS — C9141 Hairy cell leukemia, in remission: Secondary | ICD-10-CM | POA: Diagnosis not present

## 2016-01-04 DIAGNOSIS — Z95828 Presence of other vascular implants and grafts: Secondary | ICD-10-CM | POA: Diagnosis not present

## 2016-01-04 DIAGNOSIS — D472 Monoclonal gammopathy: Secondary | ICD-10-CM | POA: Diagnosis not present

## 2016-02-18 DIAGNOSIS — G3184 Mild cognitive impairment, so stated: Secondary | ICD-10-CM | POA: Diagnosis not present

## 2016-02-18 DIAGNOSIS — Z6826 Body mass index (BMI) 26.0-26.9, adult: Secondary | ICD-10-CM | POA: Diagnosis not present

## 2016-02-24 DIAGNOSIS — Z452 Encounter for adjustment and management of vascular access device: Secondary | ICD-10-CM | POA: Diagnosis not present

## 2016-03-14 DIAGNOSIS — K210 Gastro-esophageal reflux disease with esophagitis: Secondary | ICD-10-CM | POA: Diagnosis not present

## 2016-03-14 DIAGNOSIS — F325 Major depressive disorder, single episode, in full remission: Secondary | ICD-10-CM | POA: Diagnosis not present

## 2016-03-14 DIAGNOSIS — C9142 Hairy cell leukemia, in relapse: Secondary | ICD-10-CM | POA: Diagnosis not present

## 2016-03-14 DIAGNOSIS — R4189 Other symptoms and signs involving cognitive functions and awareness: Secondary | ICD-10-CM | POA: Diagnosis not present

## 2016-03-14 DIAGNOSIS — D531 Other megaloblastic anemias, not elsewhere classified: Secondary | ICD-10-CM | POA: Diagnosis not present

## 2016-03-14 DIAGNOSIS — G3184 Mild cognitive impairment, so stated: Secondary | ICD-10-CM | POA: Diagnosis not present

## 2016-03-14 DIAGNOSIS — D696 Thrombocytopenia, unspecified: Secondary | ICD-10-CM | POA: Diagnosis not present

## 2016-03-14 DIAGNOSIS — E785 Hyperlipidemia, unspecified: Secondary | ICD-10-CM | POA: Diagnosis not present

## 2016-03-15 ENCOUNTER — Other Ambulatory Visit: Payer: Self-pay

## 2016-03-23 DIAGNOSIS — J45909 Unspecified asthma, uncomplicated: Secondary | ICD-10-CM | POA: Diagnosis not present

## 2016-03-23 DIAGNOSIS — D649 Anemia, unspecified: Secondary | ICD-10-CM | POA: Diagnosis not present

## 2016-03-23 DIAGNOSIS — Z6826 Body mass index (BMI) 26.0-26.9, adult: Secondary | ICD-10-CM | POA: Diagnosis not present

## 2016-03-23 DIAGNOSIS — N401 Enlarged prostate with lower urinary tract symptoms: Secondary | ICD-10-CM | POA: Diagnosis not present

## 2016-03-23 DIAGNOSIS — K210 Gastro-esophageal reflux disease with esophagitis: Secondary | ICD-10-CM | POA: Diagnosis not present

## 2016-03-23 DIAGNOSIS — D696 Thrombocytopenia, unspecified: Secondary | ICD-10-CM | POA: Diagnosis not present

## 2016-03-23 DIAGNOSIS — Z23 Encounter for immunization: Secondary | ICD-10-CM | POA: Diagnosis not present

## 2016-03-23 DIAGNOSIS — F325 Major depressive disorder, single episode, in full remission: Secondary | ICD-10-CM | POA: Diagnosis not present

## 2016-04-06 DIAGNOSIS — Z452 Encounter for adjustment and management of vascular access device: Secondary | ICD-10-CM | POA: Diagnosis not present

## 2016-05-09 DIAGNOSIS — Z923 Personal history of irradiation: Secondary | ICD-10-CM | POA: Diagnosis not present

## 2016-05-09 DIAGNOSIS — C9141 Hairy cell leukemia, in remission: Secondary | ICD-10-CM | POA: Diagnosis not present

## 2016-05-09 DIAGNOSIS — Z9221 Personal history of antineoplastic chemotherapy: Secondary | ICD-10-CM | POA: Diagnosis not present

## 2016-05-24 DIAGNOSIS — Z452 Encounter for adjustment and management of vascular access device: Secondary | ICD-10-CM | POA: Diagnosis not present

## 2016-06-08 DIAGNOSIS — E039 Hypothyroidism, unspecified: Secondary | ICD-10-CM | POA: Diagnosis not present

## 2016-06-14 DIAGNOSIS — D696 Thrombocytopenia, unspecified: Secondary | ICD-10-CM | POA: Diagnosis not present

## 2016-06-14 DIAGNOSIS — D649 Anemia, unspecified: Secondary | ICD-10-CM | POA: Diagnosis not present

## 2016-06-14 DIAGNOSIS — N401 Enlarged prostate with lower urinary tract symptoms: Secondary | ICD-10-CM | POA: Diagnosis not present

## 2016-06-14 DIAGNOSIS — K210 Gastro-esophageal reflux disease with esophagitis: Secondary | ICD-10-CM | POA: Diagnosis not present

## 2016-06-14 DIAGNOSIS — M160 Bilateral primary osteoarthritis of hip: Secondary | ICD-10-CM | POA: Diagnosis not present

## 2016-06-14 DIAGNOSIS — C9142 Hairy cell leukemia, in relapse: Secondary | ICD-10-CM | POA: Diagnosis not present

## 2016-06-14 DIAGNOSIS — J45909 Unspecified asthma, uncomplicated: Secondary | ICD-10-CM | POA: Diagnosis not present

## 2016-06-14 DIAGNOSIS — F325 Major depressive disorder, single episode, in full remission: Secondary | ICD-10-CM | POA: Diagnosis not present

## 2016-06-20 DIAGNOSIS — C9141 Hairy cell leukemia, in remission: Secondary | ICD-10-CM | POA: Diagnosis not present

## 2016-07-05 DIAGNOSIS — Z452 Encounter for adjustment and management of vascular access device: Secondary | ICD-10-CM | POA: Diagnosis not present

## 2016-08-16 DIAGNOSIS — Z452 Encounter for adjustment and management of vascular access device: Secondary | ICD-10-CM | POA: Diagnosis not present

## 2016-08-22 DIAGNOSIS — D509 Iron deficiency anemia, unspecified: Secondary | ICD-10-CM | POA: Diagnosis not present

## 2016-08-22 DIAGNOSIS — Z08 Encounter for follow-up examination after completed treatment for malignant neoplasm: Secondary | ICD-10-CM | POA: Diagnosis not present

## 2016-08-22 DIAGNOSIS — Z856 Personal history of leukemia: Secondary | ICD-10-CM | POA: Diagnosis not present

## 2016-08-22 DIAGNOSIS — C9141 Hairy cell leukemia, in remission: Secondary | ICD-10-CM | POA: Diagnosis not present

## 2016-08-22 DIAGNOSIS — R5383 Other fatigue: Secondary | ICD-10-CM | POA: Diagnosis not present

## 2016-08-22 DIAGNOSIS — Z9221 Personal history of antineoplastic chemotherapy: Secondary | ICD-10-CM | POA: Diagnosis not present

## 2016-08-22 DIAGNOSIS — Z923 Personal history of irradiation: Secondary | ICD-10-CM | POA: Diagnosis not present

## 2016-08-22 DIAGNOSIS — D696 Thrombocytopenia, unspecified: Secondary | ICD-10-CM | POA: Diagnosis not present

## 2016-09-12 DIAGNOSIS — C9142 Hairy cell leukemia, in relapse: Secondary | ICD-10-CM | POA: Diagnosis not present

## 2016-09-12 DIAGNOSIS — C159 Malignant neoplasm of esophagus, unspecified: Secondary | ICD-10-CM | POA: Diagnosis not present

## 2016-09-12 DIAGNOSIS — D696 Thrombocytopenia, unspecified: Secondary | ICD-10-CM | POA: Diagnosis not present

## 2016-09-12 DIAGNOSIS — Z6827 Body mass index (BMI) 27.0-27.9, adult: Secondary | ICD-10-CM | POA: Diagnosis not present

## 2016-09-12 DIAGNOSIS — G3184 Mild cognitive impairment, so stated: Secondary | ICD-10-CM | POA: Diagnosis not present

## 2016-09-12 DIAGNOSIS — D649 Anemia, unspecified: Secondary | ICD-10-CM | POA: Diagnosis not present

## 2016-09-14 ENCOUNTER — Encounter (INDEPENDENT_AMBULATORY_CARE_PROVIDER_SITE_OTHER): Payer: Self-pay | Admitting: Internal Medicine

## 2016-09-14 ENCOUNTER — Encounter (INDEPENDENT_AMBULATORY_CARE_PROVIDER_SITE_OTHER): Payer: Self-pay

## 2016-09-27 DIAGNOSIS — Z452 Encounter for adjustment and management of vascular access device: Secondary | ICD-10-CM | POA: Diagnosis not present

## 2016-09-28 ENCOUNTER — Encounter (INDEPENDENT_AMBULATORY_CARE_PROVIDER_SITE_OTHER): Payer: Self-pay | Admitting: Internal Medicine

## 2016-09-28 ENCOUNTER — Ambulatory Visit (INDEPENDENT_AMBULATORY_CARE_PROVIDER_SITE_OTHER): Payer: Medicare Other | Admitting: Internal Medicine

## 2016-09-28 VITALS — BP 118/70 | HR 67 | Resp 18 | Ht 70.0 in | Wt 186.0 lb

## 2016-09-28 DIAGNOSIS — C9140 Hairy cell leukemia not having achieved remission: Secondary | ICD-10-CM | POA: Insufficient documentation

## 2016-09-28 DIAGNOSIS — K2270 Barrett's esophagus without dysplasia: Secondary | ICD-10-CM

## 2016-09-28 DIAGNOSIS — C159 Malignant neoplasm of esophagus, unspecified: Secondary | ICD-10-CM

## 2016-09-28 HISTORY — DX: Barrett's esophagus without dysplasia: K22.70

## 2016-09-28 HISTORY — DX: Malignant neoplasm of esophagus, unspecified: C15.9

## 2016-09-28 NOTE — Patient Instructions (Signed)
Dr. Laural Golden will look at chart.

## 2016-09-28 NOTE — Progress Notes (Signed)
   Subjective:    Patient ID: Geoffrey Hudson, male    DOB: May 01, 1929, 83 y.o.   MRN: [id number]  HPI Referred by Dr. Quintin Alto for Barrett's esophagus. Hx of esophageal cancer.  Saw Dr. Britta Mccreedy in his office 05/20/2011 for abnormal CT suggesting thickening of the distal esophagus.  EGD May 31, 2011 was found to have a distal esophageal mass. Biopsy : Squamous cell carcinoma. He underwent chemo and radiation at [facility]. Followed by Dr. Jacquiline Doe. . . His appetite is good. No weight loss. He denies dysphagia.  No sore throat. Takes Protonix daily.  Acid reflux is controlled He has BM x 1 a day.   07/17/2015 EGD:  Barrett's low grade dysplasia and follow up of malignant esophageal squamous cell carcinoma.  The duodenal mucosa showed no abnormalities. Mucosa of the stomach appeared normal. Small abnormal mucosa was found at the GE junction.  Long segment Barrett's esophagus with low grade dysplasia found in the distal esophagus. Biopsy: Barrett's esophagus. No dysplasia or malignancy identified.  03/14/2013 EGD: small area of Barrett's esophagus, hiatal hernia and duodenitis as well as vascular ectasia of the stomach. Biopsies revealed low grade dysplasia with radiation effect no evidence of of recurrence.    02/22/2012 EGD with Biopsy: Follow up of esophageal mass, Barrett's esophagus. Barrett's esophagus.  No evidence of mass or lesions. A hiatal hernia was found.   05/25/2011 EGD:  Hiatal  hernia. Mass in the distal esophagus.    Review of Systems Past Medical History:  Diagnosis Date  . Asthma   . Barrett's esophagus 09/28/2016  . Esophageal cancer ([place]) 09/28/2016  . Leukemia [facility])     Past Surgical History:  Procedure Laterality Date  . APPENDECTOMY    . TONSILLECTOMY      No Known Allergies  Current Outpatient Prescriptions on File Prior to Visit  Medication Sig Dispense Refill  . aspirin EC 81 MG tablet Take 81 mg by mouth daily.      . Cholecalciferol (VITAMIN D) 2000 UNITS CAPS  Take 2,000 Units by mouth daily.      Marland Kitchen dutasteride (AVODART) 0.5 MG capsule Take 0.5 mg by mouth daily.      . Fluticasone-Salmeterol (ADVAIR) 100-50 MCG/DOSE AEPB Inhale 1 puff into the lungs 2 (two) times daily.      . montelukast (SINGULAIR) 10 MG tablet Take 10 mg by mouth daily.      . pantoprazole (PROTONIX) 40 MG tablet Take 40 mg by mouth every other day.      . Tamsulosin HCl (FLOMAX) 0.4 MG CAPS Take 0.4 mg by mouth daily.       No current facility-administered medications on file prior to visit.        Objective:   Physical Exam Blood pressure 118/70, pulse 67, resp. rate 18, height '5\' 10"'$  (1.778 m), weight 186 lb (84.4 kg).  Alert and oriented. Skin warm and dry. Oral mucosa is moist.   . Sclera anicteric, conjunctivae is pink. Thyroid not enlarged. No cervical lymphadenopathy. Lungs clear. Heart regular rate and rhythm.  Abdomen is soft. Bowel sounds are positive. No hepatomegaly. No abdominal masses felt. No tenderness.  No edema to lower extremities.        Assessment & Plan:  Hx of esophageal cancer. Hx of Barrett's. Dr. Laural Golden will review chart.  Last EGD in 2016.

## 2016-10-03 DIAGNOSIS — F325 Major depressive disorder, single episode, in full remission: Secondary | ICD-10-CM | POA: Diagnosis not present

## 2016-10-03 DIAGNOSIS — E039 Hypothyroidism, unspecified: Secondary | ICD-10-CM | POA: Diagnosis not present

## 2016-10-03 DIAGNOSIS — C159 Malignant neoplasm of esophagus, unspecified: Secondary | ICD-10-CM | POA: Diagnosis not present

## 2016-10-03 DIAGNOSIS — D696 Thrombocytopenia, unspecified: Secondary | ICD-10-CM | POA: Diagnosis not present

## 2016-10-03 DIAGNOSIS — G3184 Mild cognitive impairment, so stated: Secondary | ICD-10-CM | POA: Diagnosis not present

## 2016-10-03 DIAGNOSIS — C9142 Hairy cell leukemia, in relapse: Secondary | ICD-10-CM | POA: Diagnosis not present

## 2016-10-03 DIAGNOSIS — K210 Gastro-esophageal reflux disease with esophagitis: Secondary | ICD-10-CM | POA: Diagnosis not present

## 2016-10-03 DIAGNOSIS — R4189 Other symptoms and signs involving cognitive functions and awareness: Secondary | ICD-10-CM | POA: Diagnosis not present

## 2016-10-03 DIAGNOSIS — D531 Other megaloblastic anemias, not elsewhere classified: Secondary | ICD-10-CM | POA: Diagnosis not present

## 2016-10-03 DIAGNOSIS — D649 Anemia, unspecified: Secondary | ICD-10-CM | POA: Diagnosis not present

## 2016-10-04 ENCOUNTER — Other Ambulatory Visit (INDEPENDENT_AMBULATORY_CARE_PROVIDER_SITE_OTHER): Payer: Self-pay | Admitting: Internal Medicine

## 2016-10-04 ENCOUNTER — Telehealth (INDEPENDENT_AMBULATORY_CARE_PROVIDER_SITE_OTHER): Payer: Self-pay | Admitting: Internal Medicine

## 2016-10-04 ENCOUNTER — Encounter (INDEPENDENT_AMBULATORY_CARE_PROVIDER_SITE_OTHER): Payer: Self-pay | Admitting: *Deleted

## 2016-10-04 DIAGNOSIS — C159 Malignant neoplasm of esophagus, unspecified: Secondary | ICD-10-CM

## 2016-10-04 DIAGNOSIS — K2270 Barrett's esophagus without dysplasia: Secondary | ICD-10-CM | POA: Insufficient documentation

## 2016-10-04 NOTE — Telephone Encounter (Signed)
EGD sch'd 12/01/16 at 100 (1200), patient aware, instructions mailed

## 2016-10-04 NOTE — Telephone Encounter (Signed)
Geoffrey Hudson, EGD 

## 2016-10-05 ENCOUNTER — Encounter (INDEPENDENT_AMBULATORY_CARE_PROVIDER_SITE_OTHER): Payer: Self-pay

## 2016-10-12 DIAGNOSIS — C9142 Hairy cell leukemia, in relapse: Secondary | ICD-10-CM | POA: Diagnosis not present

## 2016-10-12 DIAGNOSIS — D649 Anemia, unspecified: Secondary | ICD-10-CM | POA: Diagnosis not present

## 2016-10-12 DIAGNOSIS — F325 Major depressive disorder, single episode, in full remission: Secondary | ICD-10-CM | POA: Diagnosis not present

## 2016-10-12 DIAGNOSIS — J45909 Unspecified asthma, uncomplicated: Secondary | ICD-10-CM | POA: Diagnosis not present

## 2016-10-12 DIAGNOSIS — M160 Bilateral primary osteoarthritis of hip: Secondary | ICD-10-CM | POA: Diagnosis not present

## 2016-10-12 DIAGNOSIS — D696 Thrombocytopenia, unspecified: Secondary | ICD-10-CM | POA: Diagnosis not present

## 2016-10-12 DIAGNOSIS — K210 Gastro-esophageal reflux disease with esophagitis: Secondary | ICD-10-CM | POA: Diagnosis not present

## 2016-10-12 DIAGNOSIS — N401 Enlarged prostate with lower urinary tract symptoms: Secondary | ICD-10-CM | POA: Diagnosis not present

## 2016-10-24 DIAGNOSIS — Z6827 Body mass index (BMI) 27.0-27.9, adult: Secondary | ICD-10-CM | POA: Diagnosis not present

## 2016-10-24 DIAGNOSIS — R05 Cough: Secondary | ICD-10-CM | POA: Diagnosis not present

## 2016-11-08 DIAGNOSIS — Z452 Encounter for adjustment and management of vascular access device: Secondary | ICD-10-CM | POA: Diagnosis not present

## 2016-11-16 DIAGNOSIS — R4189 Other symptoms and signs involving cognitive functions and awareness: Secondary | ICD-10-CM | POA: Diagnosis not present

## 2016-11-16 DIAGNOSIS — N401 Enlarged prostate with lower urinary tract symptoms: Secondary | ICD-10-CM | POA: Diagnosis not present

## 2016-11-16 DIAGNOSIS — C159 Malignant neoplasm of esophagus, unspecified: Secondary | ICD-10-CM | POA: Diagnosis not present

## 2016-11-16 DIAGNOSIS — E039 Hypothyroidism, unspecified: Secondary | ICD-10-CM | POA: Diagnosis not present

## 2016-11-16 DIAGNOSIS — D531 Other megaloblastic anemias, not elsewhere classified: Secondary | ICD-10-CM | POA: Diagnosis not present

## 2016-11-16 DIAGNOSIS — D696 Thrombocytopenia, unspecified: Secondary | ICD-10-CM | POA: Diagnosis not present

## 2016-11-16 DIAGNOSIS — C9142 Hairy cell leukemia, in relapse: Secondary | ICD-10-CM | POA: Diagnosis not present

## 2016-11-16 DIAGNOSIS — G3184 Mild cognitive impairment, so stated: Secondary | ICD-10-CM | POA: Diagnosis not present

## 2016-11-28 DIAGNOSIS — Z6826 Body mass index (BMI) 26.0-26.9, adult: Secondary | ICD-10-CM | POA: Diagnosis not present

## 2016-11-28 DIAGNOSIS — J209 Acute bronchitis, unspecified: Secondary | ICD-10-CM | POA: Diagnosis not present

## 2016-11-28 DIAGNOSIS — J019 Acute sinusitis, unspecified: Secondary | ICD-10-CM | POA: Diagnosis not present

## 2016-11-30 ENCOUNTER — Telehealth (INDEPENDENT_AMBULATORY_CARE_PROVIDER_SITE_OTHER): Payer: Self-pay | Admitting: *Deleted

## 2016-11-30 NOTE — Telephone Encounter (Signed)
Patient's daughter called wanting to speak with Terri about her dad's procedure that is scheduled for tomorrow, she states she has some concerns. Please call Magda Paganini at [phone]

## 2016-11-30 NOTE — Telephone Encounter (Signed)
Talked with patient's daughter , Magda Paganini. Patient is to have endoscopy tomorrow,12/01/2016. Patient has been sick x 1 week. Bad cough, at first it was a wet ,non productive cough, he saw PCP on Monday. He was given Zithromax and Prednisone. His cough has become consistent. He had to sleep in recliner last night Cough is consistent and deep.  Their questions are: How will the medication used(sedation) going to affect him? He cannot lay flat. If we cancel , when will be able to get back in , as it has taken [place] 2-3 months to get this appointment?  Per Dr.Rehman if it is felt it is lung, Bronchial we will cancel and do it next week or when he is feeling better. If is is his esophagus, we should precede as this may be the cause of the cough.  Have the patient's daughter call office in the morning to tell us how he is before canceling it. Magda Paganini was called and made aware. Forwarded to [employer] as Juluis Rainier.

## 2016-12-01 ENCOUNTER — Encounter (HOSPITAL_COMMUNITY): Payer: Self-pay

## 2016-12-01 ENCOUNTER — Ambulatory Visit (HOSPITAL_COMMUNITY)
Admission: RE | Admit: 2016-12-01 | Discharge: 2016-12-01 | Disposition: A | Discharge status: Home / Self Care | Payer: Medicare Other | Source: Ambulatory Visit | Attending: Internal Medicine | Admitting: Internal Medicine

## 2016-12-01 ENCOUNTER — Encounter (HOSPITAL_COMMUNITY)
Admission: RE | Disposition: A | Discharge status: Home / Self Care | Payer: Self-pay | Source: Ambulatory Visit | Attending: Internal Medicine

## 2016-12-01 DIAGNOSIS — Z7951 Long term (current) use of inhaled steroids: Secondary | ICD-10-CM | POA: Diagnosis not present

## 2016-12-01 DIAGNOSIS — Z9221 Personal history of antineoplastic chemotherapy: Secondary | ICD-10-CM | POA: Insufficient documentation

## 2016-12-01 DIAGNOSIS — C159 Malignant neoplasm of esophagus, unspecified: Secondary | ICD-10-CM

## 2016-12-01 DIAGNOSIS — Z800 Family history of malignant neoplasm of digestive organs: Secondary | ICD-10-CM | POA: Diagnosis not present

## 2016-12-01 DIAGNOSIS — K2270 Barrett's esophagus without dysplasia: Secondary | ICD-10-CM | POA: Diagnosis not present

## 2016-12-01 DIAGNOSIS — Z79899 Other long term (current) drug therapy: Secondary | ICD-10-CM | POA: Diagnosis not present

## 2016-12-01 DIAGNOSIS — K3189 Other diseases of stomach and duodenum: Secondary | ICD-10-CM | POA: Diagnosis not present

## 2016-12-01 DIAGNOSIS — K269 Duodenal ulcer, unspecified as acute or chronic, without hemorrhage or perforation: Secondary | ICD-10-CM | POA: Insufficient documentation

## 2016-12-01 DIAGNOSIS — J45909 Unspecified asthma, uncomplicated: Secondary | ICD-10-CM | POA: Diagnosis not present

## 2016-12-01 DIAGNOSIS — Z87891 Personal history of nicotine dependence: Secondary | ICD-10-CM | POA: Diagnosis not present

## 2016-12-01 DIAGNOSIS — K22711 Barrett's esophagus with high grade dysplasia: Secondary | ICD-10-CM | POA: Diagnosis not present

## 2016-12-01 DIAGNOSIS — Z8501 Personal history of malignant neoplasm of esophagus: Secondary | ICD-10-CM | POA: Diagnosis not present

## 2016-12-01 DIAGNOSIS — Z7983 Long term (current) use of bisphosphonates: Secondary | ICD-10-CM | POA: Insufficient documentation

## 2016-12-01 DIAGNOSIS — Z91010 Allergy to peanuts: Secondary | ICD-10-CM | POA: Diagnosis not present

## 2016-12-01 DIAGNOSIS — K2990 Gastroduodenitis, unspecified, without bleeding: Secondary | ICD-10-CM

## 2016-12-01 DIAGNOSIS — K449 Diaphragmatic hernia without obstruction or gangrene: Secondary | ICD-10-CM | POA: Insufficient documentation

## 2016-12-01 DIAGNOSIS — Z923 Personal history of irradiation: Secondary | ICD-10-CM | POA: Diagnosis not present

## 2016-12-01 HISTORY — PX: ESOPHAGOGASTRODUODENOSCOPY: SHX5428

## 2016-12-01 SURGERY — EGD (ESOPHAGOGASTRODUODENOSCOPY)
Anesthesia: Moderate Sedation

## 2016-12-01 MED ORDER — STERILE WATER FOR IRRIGATION IR SOLN
Status: DC | PRN
  Administered 2016-12-01: 13:00:00

## 2016-12-01 MED ORDER — MEPERIDINE HCL 50 MG/ML IJ SOLN
INTRAMUSCULAR | Status: DC | PRN
  Administered 2016-12-01 (×2): 20 mg via INTRAVENOUS

## 2016-12-01 MED ORDER — MIDAZOLAM HCL 5 MG/5ML IJ SOLN
INTRAMUSCULAR | Status: AC
  Filled 2016-12-01: qty 10

## 2016-12-01 MED ORDER — MEPERIDINE HCL 50 MG/ML IJ SOLN
INTRAMUSCULAR | Status: AC
  Filled 2016-12-01: qty 1

## 2016-12-01 MED ORDER — LIDOCAINE VISCOUS 2 % MT SOLN
OROMUCOSAL | Status: DC | PRN
  Administered 2016-12-01: 1 via OROMUCOSAL

## 2016-12-01 MED ORDER — LIDOCAINE VISCOUS 2 % MT SOLN
OROMUCOSAL | Status: AC
  Filled 2016-12-01: qty 15

## 2016-12-01 MED ORDER — SODIUM CHLORIDE 0.9 % IV SOLN
INTRAVENOUS | Status: DC
  Administered 2016-12-01: 12:00:00 via INTRAVENOUS

## 2016-12-01 MED ORDER — MIDAZOLAM HCL 5 MG/5ML IJ SOLN
INTRAMUSCULAR | Status: DC | PRN
  Administered 2016-12-01 (×2): 1 mg via INTRAVENOUS
  Administered 2016-12-01: 0.5 mg via INTRAVENOUS

## 2016-12-01 NOTE — Telephone Encounter (Signed)
Patient's daughter Magda Paganini left message, he is doing better and will be ale to do endoscopy today

## 2016-12-01 NOTE — Discharge Instructions (Signed)
Resume aspirin on 12/03/2016  Resume other medications and diet as before No driving for 24 hours. Physician will call with results of blood tests and biopsy.  Esophagogastroduodenoscopy, Care After Refer to this sheet in the next few weeks. These instructions provide you with information about caring for yourself after your procedure. Your health care provider may also give you more specific instructions. Your treatment has been planned according to current medical practices, but problems sometimes occur. Call your health care provider if you have any problems or questions after your procedure. What can I expect after the procedure? After the procedure, it is common to have:  A sore throat.  Nausea.  Bloating.  Dizziness.  Fatigue. Follow these instructions at home:  Do not eat or drink anything until the numbing medicine (local anesthetic) has worn off and your gag reflex has returned. You will know that the local anesthetic has worn off when you can swallow comfortably.  Do not drive for 24 hours if you received a medicine to help you relax (sedative).  If your health care provider took a tissue sample for testing during the procedure, make sure to get your test results. This is your responsibility. Ask your health care provider or the department performing the test when your results will be ready.  Keep all follow-up visits as told by your health care provider. This is important. Contact a health care provider if:  You cannot stop coughing.  You are not urinating.  You are urinating less than usual. Get help right away if:  You have trouble swallowing.  You cannot eat or drink.  You have throat or chest pain that gets worse.  You are dizzy or light-headed.  You faint.  You have nausea or vomiting.  You have chills.  You have a fever.  You have severe abdominal pain.  You have black, tarry, or bloody stools. This information is not intended to replace advice  given to you by your health care provider. Make sure you discuss any questions you have with your health care provider. Document Released: 06/20/2012 Document Revised: 12/10/2015 Document Reviewed: 05/28/2015 Elsevier Interactive Patient Education  2017 [employer].

## 2016-12-01 NOTE — Op Note (Signed)
[facility] Patient Name: Geoffrey Hudson Procedure Date: 12/01/2016 12:30 PM MRN: [id number] Date of Birth: 1928-07-20 Attending MD: Hildred Laser , MD CSN: [id number] Age: 83 Admit Type: Outpatient Procedure:                Upper GI endoscopy Indications:              Follow-up of Barrett's esophagus, Family history of                            esophageal cancer Providers:                Hildred Laser, MD, Lurline Del, RN, Aram Candela Referring MD:             Manon Hilding, MD Medicines:                Lidocaine spray, Meperidine 40 mg IV, Midazolam 2                            mg IV Complications:            No immediate complications. Estimated Blood Loss:     Estimated blood loss was minimal. Procedure:                Pre-Anesthesia Assessment:                           - Prior to the procedure, a History and Physical                            was performed, and patient medications and                            allergies were reviewed. The patient's tolerance of                            previous anesthesia was also reviewed. The risks                            and benefits of the procedure and the sedation                            options and risks were discussed with the patient.                            All questions were answered, and informed consent                            was obtained. Prior Anticoagulants: The patient                            last took aspirin 1 day prior to the procedure. ASA                            Grade Assessment: II - A patient with mild systemic  disease. After reviewing the risks and benefits,                            the patient was deemed in satisfactory condition to                            undergo the procedure.                           After obtaining informed consent, the endoscope was                            passed under direct vision. Throughout the   procedure, the patient's blood pressure, pulse, and                            oxygen saturations were monitored continuously. The                            EG-[id number] ([id number]) scope was introduced through the                            mouth, and advanced to the second part of duodenum.                            The upper GI endoscopy was accomplished without                            difficulty. The patient tolerated the procedure                            well. Scope In: 1:05:18 PM Scope Out: 1:15:02 PM Total Procedure Duration: 0 hours 9 minutes 44 seconds  Findings:      The proximal esophagus and mid esophagus were normal.      There were esophageal mucosal changes secondary to established       long-segment Barrett's disease present in the distal esophagus. The       maximum longitudinal extent of these mucosal changes was 5 cm in length.       Mucosa was biopsied with a cold forceps for histology in 4 quadrants 1       and 3 cm proximal to the GE junction. A total of 2 specimen bottles were       sent to pathology.      The Z-line was regular and was found 35 cm from the incisors.      A 5 cm hiatal hernia was present.      A few erosions were found in the gastric antrum.      A few erosions without bleeding were found in the duodenal bulb.      The second portion of the duodenum was normal. Impression:               - Normal proximal esophagus and mid esophagus.                           - Esophageal mucosal changes secondary to  established long-segment Barrett's disease.                            Barrett's length is 5 cm and it is non-circumfeal.                            Biopsied.                           - Z-line regular, 35 cm from the incisors.                           - 5 cm hiatal hernia.                           - Erosive gastropathy.                           - Duodenal erosions without bleeding.                           - Normal  second portion of the duodenum. Moderate Sedation:      Moderate (conscious) sedation was administered by the endoscopy nurse       and supervised by the endoscopist. The following parameters were       monitored: oxygen saturation, heart rate, blood pressure, CO2       capnography and response to care. Total physician intraservice time was       16 minutes. Recommendation:           - Patient has a contact number available for                            emergencies. The signs and symptoms of potential                            delayed complications were discussed with the                            patient. Return to normal activities tomorrow.                            Written discharge instructions were provided to the                            patient.                           - Resume previous diet today.                           - Continue present medications.                           - No aspirin, ibuprofen, naproxen, or other                            non-steroidal anti-inflammatory  drugs for 2 days                            after biopsy.                           - Await pathology results.                           - H. pylori serology. Procedure Code(s):        --- Professional ---                           [phone], Esophagogastroduodenoscopy, flexible,                            transoral; with biopsy, single or multiple                           99152, Moderate sedation services provided by the                            same physician or other qualified health care                            professional performing the diagnostic or                            therapeutic service that the sedation supports,                            requiring the presence of an independent trained                            observer to assist in the monitoring of the                            patient's level of consciousness and physiological                            status;  initial 15 minutes of intraservice time,                            patient age 14 years or older Diagnosis Code(s):        --- Professional ---                           K22.70, Barrett's esophagus without dysplasia                           K44.9, Diaphragmatic hernia without obstruction or                            gangrene                           K31.89, Other  diseases of stomach and duodenum                           K26.9, Duodenal ulcer, unspecified as acute or                            chronic, without hemorrhage or perforation                           Z80.0, Family history of malignant neoplasm of                            digestive organs CPT copyright 2016 [employer]. All rights reserved. The codes documented in this report are preliminary and upon coder review may  be revised to meet current compliance requirements. Hildred Laser, MD Hildred Laser, MD 12/01/2016 1:32:25 PM This report has been signed electronically. Number of Addenda: 0

## 2016-12-01 NOTE — H&P (Signed)
Geoffrey Hudson is an 83 y.o. male.   Chief Complaint: Patient is here for EGD. 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 EGD with biopsy was in December 2016 by Dr. Doristine Mango and revealed low-grade dysplasia. Patient says heartburns well controlled with therapy and he denies dysphagia. He's had recent onset of cough and was treated with 4 days of prednisone and Z-Pak. He feels much better. He did not experience fever dyspnea productive cough for he has good appetite and his weight has been stable.  Past Medical History:  Diagnosis Date  . Asthma   . Barrett's esophagus 09/28/2016  . Esophageal cancer ([place]) 09/28/2016  . Leukemia [facility])     Past Surgical History:  Procedure Laterality Date  . APPENDECTOMY    . TONSILLECTOMY      History reviewed. No pertinent family history. Social History:  reports that he has quit smoking. His smoking use included Cigarettes. He has never used smokeless tobacco. He reports that he drinks alcohol. He reports that he does not use drugs.  Allergies:  Allergies  Allergen Reactions  . Peanut-Containing Drug Products Other (See Comments)    Walnuts showed up on allergy list    Medications Prior to Admission  Medication Sig Dispense Refill  . acetaminophen (TYLENOL) 500 MG tablet Take 1,000 mg by mouth 2 (two) times daily.    Marland Kitchen ADVAIR DISKUS 250-50 MCG/DOSE AEPB Inhale 1 puff into the lungs 2 (two) times daily.  3  . Cholecalciferol (VITAMIN D) 2000 UNITS CAPS Take 2,000 Units by mouth See admin instructions. 5 times weekly    . citalopram (CELEXA) 20 MG tablet Take 20 mg by mouth daily.    Marland Kitchen dutasteride (AVODART) 0.5 MG capsule Take 0.5 mg by mouth daily.      Marland Kitchen levothyroxine (SYNTHROID, LEVOTHROID) 50 MCG tablet Take 50 mcg by mouth daily.  2  . montelukast  (SINGULAIR) 10 MG tablet Take 10 mg by mouth daily.      . Multiple Vitamin (MULTIVITAMIN WITH MINERALS) TABS tablet Take 1 tablet by mouth daily.    . pantoprazole (PROTONIX) 40 MG tablet Take 40 mg by mouth daily.     . Tamsulosin HCl (FLOMAX) 0.4 MG CAPS Take 0.4 mg by mouth daily.        No results found for this or any previous visit (from the past 48 hour(s)). No results found.  ROS  Blood pressure 118/63, pulse 78, temperature 97.9 F (36.6 C), temperature source Oral, resp. rate 11, height '5\' 10"'$  (1.778 m), weight 185 lb (83.9 kg), SpO2 97 %. Physical Exam  Constitutional: He appears well-developed and well-nourished.  HENT:  Mouth/Throat: Oropharynx is clear and moist.  Eyes: Conjunctivae are normal. No scleral icterus.  Neck: No thyromegaly present.  Cardiovascular: Normal rate, regular rhythm and normal heart sounds.   No murmur heard. Respiratory: Effort normal and breath sounds normal.  Right Port-A-Cath in place  GI: Soft. He exhibits no distension and no mass. There is no tenderness.  Musculoskeletal: He exhibits no edema.  Lymphadenopathy:    He has no cervical adenopathy.  Neurological: He is alert.  Skin: Skin is warm and dry.     Assessment/Plan History of Barrett's esophagus and squamous cell carcinoma of esophagus. Surveillance EGD.  Hildred Laser, MD 12/01/2016, 12:54 PM

## 2016-12-02 LAB — H. PYLORI ANTIBODY, IGG: H Pylori IgG: <0.8 Index Value (ref 0.00–0.79)

## 2016-12-05 ENCOUNTER — Encounter (HOSPITAL_COMMUNITY): Payer: Self-pay | Admitting: Internal Medicine

## 2016-12-05 DIAGNOSIS — D539 Nutritional anemia, unspecified: Secondary | ICD-10-CM | POA: Diagnosis not present

## 2016-12-05 DIAGNOSIS — R05 Cough: Secondary | ICD-10-CM | POA: Diagnosis not present

## 2016-12-05 DIAGNOSIS — D696 Thrombocytopenia, unspecified: Secondary | ICD-10-CM | POA: Diagnosis not present

## 2016-12-05 DIAGNOSIS — R5383 Other fatigue: Secondary | ICD-10-CM | POA: Diagnosis not present

## 2016-12-05 DIAGNOSIS — C9141 Hairy cell leukemia, in remission: Secondary | ICD-10-CM | POA: Diagnosis not present

## 2016-12-05 DIAGNOSIS — Z87891 Personal history of nicotine dependence: Secondary | ICD-10-CM | POA: Diagnosis not present

## 2016-12-07 ENCOUNTER — Telehealth (INDEPENDENT_AMBULATORY_CARE_PROVIDER_SITE_OTHER): Payer: Self-pay | Admitting: Internal Medicine

## 2016-12-07 NOTE — Telephone Encounter (Signed)
Patient's daughter Secundino Ellithorpe called and stated that the patient had a procedure done last Thursday and that she had understood, but may be totally mistaken,  that Dr. Laural Golden was going to be calling them this week with the results of the labs and procedure.  She stated that Dr. Laural Golden said he would probably be in touch on Monday and it's now Wednesday at 3:45pm.  Is it a case of no news is good news and he's not going to call because he didn't see anything or is he's going to call with an update on those biopsies.  I did return the patient's call and explained that Dr. Laural Golden had a death in the family and he is in procedures today and tomorrow, but he himself would call with the results.  I did tell her that I would send this information to his nurse Tammy.  She was fine and was very sympathetic to Dr. Olevia Perches situation.  [phone]

## 2016-12-08 NOTE — Telephone Encounter (Signed)
This was reviewed with Dr.Rehman and he is going to call the patient and discuss the results. Forwarded to [place] for documentation.

## 2016-12-08 NOTE — Telephone Encounter (Signed)
Addressed.

## 2016-12-13 DIAGNOSIS — N401 Enlarged prostate with lower urinary tract symptoms: Secondary | ICD-10-CM | POA: Diagnosis not present

## 2016-12-13 DIAGNOSIS — R05 Cough: Secondary | ICD-10-CM | POA: Diagnosis not present

## 2016-12-13 DIAGNOSIS — J45909 Unspecified asthma, uncomplicated: Secondary | ICD-10-CM | POA: Diagnosis not present

## 2016-12-13 DIAGNOSIS — D696 Thrombocytopenia, unspecified: Secondary | ICD-10-CM | POA: Diagnosis not present

## 2016-12-13 DIAGNOSIS — D649 Anemia, unspecified: Secondary | ICD-10-CM | POA: Diagnosis not present

## 2016-12-13 DIAGNOSIS — F325 Major depressive disorder, single episode, in full remission: Secondary | ICD-10-CM | POA: Diagnosis not present

## 2016-12-13 DIAGNOSIS — Z6826 Body mass index (BMI) 26.0-26.9, adult: Secondary | ICD-10-CM | POA: Diagnosis not present

## 2016-12-13 DIAGNOSIS — K210 Gastro-esophageal reflux disease with esophagitis: Secondary | ICD-10-CM | POA: Diagnosis not present

## 2016-12-20 DIAGNOSIS — Z452 Encounter for adjustment and management of vascular access device: Secondary | ICD-10-CM | POA: Diagnosis not present

## 2016-12-22 ENCOUNTER — Telehealth (INDEPENDENT_AMBULATORY_CARE_PROVIDER_SITE_OTHER): Payer: Self-pay | Admitting: Internal Medicine

## 2016-12-22 NOTE — Telephone Encounter (Signed)
Daughter ,Magda Paganini was called and made aware that the information had been sent to [facility]. Gaspar Cola was called and they confirmed that they had rec'd the information and would be calling the patient.

## 2016-12-22 NOTE — Telephone Encounter (Signed)
Patient's daughter Magda Paganini called, stated that the patient had some surgery by Dr. Audelia Acton in [facility] and they had understood that Dr. Laural Golden was going to get in touch with him and have him call them and it's been two weeks.  She wants to make sure they have not fallen through the cracks or missed his call somehow.  She stated to call back if this is not an unreasonable thing.  She apologized for being incoherent.  [phone]

## 2017-01-05 DIAGNOSIS — Z961 Presence of intraocular lens: Secondary | ICD-10-CM | POA: Diagnosis not present

## 2017-01-24 DIAGNOSIS — R42 Dizziness and giddiness: Secondary | ICD-10-CM | POA: Diagnosis not present

## 2017-01-24 DIAGNOSIS — M25552 Pain in left hip: Secondary | ICD-10-CM | POA: Diagnosis not present

## 2017-01-24 DIAGNOSIS — E039 Hypothyroidism, unspecified: Secondary | ICD-10-CM | POA: Diagnosis not present

## 2017-01-24 DIAGNOSIS — G3184 Mild cognitive impairment, so stated: Secondary | ICD-10-CM | POA: Diagnosis not present

## 2017-01-24 DIAGNOSIS — D649 Anemia, unspecified: Secondary | ICD-10-CM | POA: Diagnosis not present

## 2017-01-24 DIAGNOSIS — Z6825 Body mass index (BMI) 25.0-25.9, adult: Secondary | ICD-10-CM | POA: Diagnosis not present

## 2017-02-01 DIAGNOSIS — Z452 Encounter for adjustment and management of vascular access device: Secondary | ICD-10-CM | POA: Diagnosis not present

## 2017-02-07 DIAGNOSIS — D61818 Other pancytopenia: Secondary | ICD-10-CM | POA: Diagnosis not present

## 2017-02-07 DIAGNOSIS — Z6825 Body mass index (BMI) 25.0-25.9, adult: Secondary | ICD-10-CM | POA: Diagnosis not present

## 2017-02-07 DIAGNOSIS — K22711 Barrett's esophagus with high grade dysplasia: Secondary | ICD-10-CM | POA: Diagnosis not present

## 2017-02-07 DIAGNOSIS — C9142 Hairy cell leukemia, in relapse: Secondary | ICD-10-CM | POA: Diagnosis not present

## 2017-02-07 DIAGNOSIS — D696 Thrombocytopenia, unspecified: Secondary | ICD-10-CM | POA: Diagnosis not present

## 2017-02-07 DIAGNOSIS — D649 Anemia, unspecified: Secondary | ICD-10-CM | POA: Diagnosis not present

## 2017-02-13 DIAGNOSIS — I2510 Atherosclerotic heart disease of native coronary artery without angina pectoris: Secondary | ICD-10-CM | POA: Diagnosis not present

## 2017-02-13 DIAGNOSIS — R05 Cough: Secondary | ICD-10-CM | POA: Diagnosis not present

## 2017-02-13 DIAGNOSIS — R918 Other nonspecific abnormal finding of lung field: Secondary | ICD-10-CM | POA: Diagnosis not present

## 2017-02-13 DIAGNOSIS — I700 Atherosclerosis of aorta: Secondary | ICD-10-CM | POA: Diagnosis not present

## 2017-02-13 DIAGNOSIS — J432 Centrilobular emphysema: Secondary | ICD-10-CM | POA: Diagnosis not present

## 2017-02-28 DIAGNOSIS — J449 Chronic obstructive pulmonary disease, unspecified: Secondary | ICD-10-CM | POA: Diagnosis not present

## 2017-02-28 DIAGNOSIS — Z79899 Other long term (current) drug therapy: Secondary | ICD-10-CM | POA: Diagnosis not present

## 2017-02-28 DIAGNOSIS — Z856 Personal history of leukemia: Secondary | ICD-10-CM | POA: Diagnosis not present

## 2017-02-28 DIAGNOSIS — Z6824 Body mass index (BMI) 24.0-24.9, adult: Secondary | ICD-10-CM | POA: Diagnosis not present

## 2017-02-28 DIAGNOSIS — K449 Diaphragmatic hernia without obstruction or gangrene: Secondary | ICD-10-CM | POA: Diagnosis not present

## 2017-02-28 DIAGNOSIS — K22711 Barrett's esophagus with high grade dysplasia: Secondary | ICD-10-CM | POA: Diagnosis not present

## 2017-02-28 DIAGNOSIS — Z7982 Long term (current) use of aspirin: Secondary | ICD-10-CM | POA: Diagnosis not present

## 2017-02-28 DIAGNOSIS — Z85828 Personal history of other malignant neoplasm of skin: Secondary | ICD-10-CM | POA: Diagnosis not present

## 2017-03-27 DIAGNOSIS — Z8501 Personal history of malignant neoplasm of esophagus: Secondary | ICD-10-CM | POA: Diagnosis not present

## 2017-03-27 DIAGNOSIS — C9141 Hairy cell leukemia, in remission: Secondary | ICD-10-CM | POA: Diagnosis not present

## 2017-03-27 DIAGNOSIS — Z9221 Personal history of antineoplastic chemotherapy: Secondary | ICD-10-CM | POA: Diagnosis not present

## 2017-03-27 DIAGNOSIS — D539 Nutritional anemia, unspecified: Secondary | ICD-10-CM | POA: Diagnosis not present

## 2017-03-27 DIAGNOSIS — R05 Cough: Secondary | ICD-10-CM | POA: Diagnosis not present

## 2017-03-27 DIAGNOSIS — Z923 Personal history of irradiation: Secondary | ICD-10-CM | POA: Diagnosis not present

## 2017-03-29 DIAGNOSIS — Z452 Encounter for adjustment and management of vascular access device: Secondary | ICD-10-CM | POA: Diagnosis not present

## 2017-03-30 DIAGNOSIS — C9142 Hairy cell leukemia, in relapse: Secondary | ICD-10-CM | POA: Diagnosis not present

## 2017-03-30 DIAGNOSIS — R0609 Other forms of dyspnea: Secondary | ICD-10-CM | POA: Diagnosis not present

## 2017-03-30 DIAGNOSIS — Z6826 Body mass index (BMI) 26.0-26.9, adult: Secondary | ICD-10-CM | POA: Diagnosis not present

## 2017-03-30 DIAGNOSIS — K22711 Barrett's esophagus with high grade dysplasia: Secondary | ICD-10-CM | POA: Diagnosis not present

## 2017-03-30 DIAGNOSIS — I69398 Other sequelae of cerebral infarction: Secondary | ICD-10-CM | POA: Diagnosis not present

## 2017-03-30 DIAGNOSIS — L723 Sebaceous cyst: Secondary | ICD-10-CM | POA: Diagnosis not present

## 2017-03-30 DIAGNOSIS — Z23 Encounter for immunization: Secondary | ICD-10-CM | POA: Diagnosis not present

## 2017-05-09 DIAGNOSIS — K22711 Barrett's esophagus with high grade dysplasia: Secondary | ICD-10-CM | POA: Diagnosis not present

## 2017-05-09 DIAGNOSIS — H9190 Unspecified hearing loss, unspecified ear: Secondary | ICD-10-CM | POA: Diagnosis not present

## 2017-05-09 DIAGNOSIS — Z9049 Acquired absence of other specified parts of digestive tract: Secondary | ICD-10-CM | POA: Diagnosis not present

## 2017-05-09 DIAGNOSIS — J449 Chronic obstructive pulmonary disease, unspecified: Secondary | ICD-10-CM | POA: Diagnosis not present

## 2017-05-09 DIAGNOSIS — K449 Diaphragmatic hernia without obstruction or gangrene: Secondary | ICD-10-CM | POA: Diagnosis not present

## 2017-05-11 DIAGNOSIS — Z452 Encounter for adjustment and management of vascular access device: Secondary | ICD-10-CM | POA: Diagnosis not present

## 2017-06-22 DIAGNOSIS — Z452 Encounter for adjustment and management of vascular access device: Secondary | ICD-10-CM | POA: Diagnosis not present

## 2017-06-27 DIAGNOSIS — C159 Malignant neoplasm of esophagus, unspecified: Secondary | ICD-10-CM | POA: Diagnosis not present

## 2017-06-27 DIAGNOSIS — K210 Gastro-esophageal reflux disease with esophagitis: Secondary | ICD-10-CM | POA: Diagnosis not present

## 2017-06-27 DIAGNOSIS — C9142 Hairy cell leukemia, in relapse: Secondary | ICD-10-CM | POA: Diagnosis not present

## 2017-06-29 DIAGNOSIS — N401 Enlarged prostate with lower urinary tract symptoms: Secondary | ICD-10-CM | POA: Diagnosis not present

## 2017-06-29 DIAGNOSIS — F325 Major depressive disorder, single episode, in full remission: Secondary | ICD-10-CM | POA: Diagnosis not present

## 2017-06-29 DIAGNOSIS — M160 Bilateral primary osteoarthritis of hip: Secondary | ICD-10-CM | POA: Diagnosis not present

## 2017-06-29 DIAGNOSIS — J45909 Unspecified asthma, uncomplicated: Secondary | ICD-10-CM | POA: Diagnosis not present

## 2017-06-29 DIAGNOSIS — Z23 Encounter for immunization: Secondary | ICD-10-CM | POA: Diagnosis not present

## 2017-06-29 DIAGNOSIS — D696 Thrombocytopenia, unspecified: Secondary | ICD-10-CM | POA: Diagnosis not present

## 2017-06-29 DIAGNOSIS — K210 Gastro-esophageal reflux disease with esophagitis: Secondary | ICD-10-CM | POA: Diagnosis not present

## 2017-06-29 DIAGNOSIS — D649 Anemia, unspecified: Secondary | ICD-10-CM | POA: Diagnosis not present

## 2017-07-25 DIAGNOSIS — K449 Diaphragmatic hernia without obstruction or gangrene: Secondary | ICD-10-CM | POA: Diagnosis not present

## 2017-07-25 DIAGNOSIS — K2270 Barrett's esophagus without dysplasia: Secondary | ICD-10-CM | POA: Diagnosis not present

## 2017-07-25 DIAGNOSIS — K22711 Barrett's esophagus with high grade dysplasia: Secondary | ICD-10-CM | POA: Diagnosis not present

## 2017-08-03 DIAGNOSIS — Z452 Encounter for adjustment and management of vascular access device: Secondary | ICD-10-CM | POA: Diagnosis not present

## 2017-09-19 DIAGNOSIS — Z7951 Long term (current) use of inhaled steroids: Secondary | ICD-10-CM | POA: Diagnosis not present

## 2017-09-19 DIAGNOSIS — H9193 Unspecified hearing loss, bilateral: Secondary | ICD-10-CM | POA: Diagnosis not present

## 2017-09-19 DIAGNOSIS — Z09 Encounter for follow-up examination after completed treatment for conditions other than malignant neoplasm: Secondary | ICD-10-CM | POA: Diagnosis not present

## 2017-09-19 DIAGNOSIS — K449 Diaphragmatic hernia without obstruction or gangrene: Secondary | ICD-10-CM | POA: Diagnosis not present

## 2017-09-19 DIAGNOSIS — Z7982 Long term (current) use of aspirin: Secondary | ICD-10-CM | POA: Diagnosis not present

## 2017-09-19 DIAGNOSIS — J449 Chronic obstructive pulmonary disease, unspecified: Secondary | ICD-10-CM | POA: Diagnosis not present

## 2017-09-19 DIAGNOSIS — Z79891 Long term (current) use of opiate analgesic: Secondary | ICD-10-CM | POA: Diagnosis not present

## 2017-09-19 DIAGNOSIS — Z800 Family history of malignant neoplasm of digestive organs: Secondary | ICD-10-CM | POA: Diagnosis not present

## 2017-09-19 DIAGNOSIS — K22711 Barrett's esophagus with high grade dysplasia: Secondary | ICD-10-CM | POA: Diagnosis not present

## 2017-09-19 DIAGNOSIS — N400 Enlarged prostate without lower urinary tract symptoms: Secondary | ICD-10-CM | POA: Diagnosis not present

## 2017-09-19 DIAGNOSIS — Z8719 Personal history of other diseases of the digestive system: Secondary | ICD-10-CM | POA: Diagnosis not present

## 2017-09-19 DIAGNOSIS — Z85828 Personal history of other malignant neoplasm of skin: Secondary | ICD-10-CM | POA: Diagnosis not present

## 2017-09-19 DIAGNOSIS — Z79899 Other long term (current) drug therapy: Secondary | ICD-10-CM | POA: Diagnosis not present

## 2017-09-19 DIAGNOSIS — K2270 Barrett's esophagus without dysplasia: Secondary | ICD-10-CM | POA: Diagnosis not present

## 2017-09-19 DIAGNOSIS — Z856 Personal history of leukemia: Secondary | ICD-10-CM | POA: Diagnosis not present

## 2017-09-22 DIAGNOSIS — Z452 Encounter for adjustment and management of vascular access device: Secondary | ICD-10-CM | POA: Diagnosis not present

## 2017-09-25 DIAGNOSIS — D696 Thrombocytopenia, unspecified: Secondary | ICD-10-CM | POA: Diagnosis not present

## 2017-09-25 DIAGNOSIS — C9141 Hairy cell leukemia, in remission: Secondary | ICD-10-CM | POA: Diagnosis not present

## 2017-09-25 DIAGNOSIS — D649 Anemia, unspecified: Secondary | ICD-10-CM | POA: Diagnosis not present

## 2017-10-24 DIAGNOSIS — Z452 Encounter for adjustment and management of vascular access device: Secondary | ICD-10-CM | POA: Diagnosis not present

## 2017-11-02 DIAGNOSIS — R7301 Impaired fasting glucose: Secondary | ICD-10-CM | POA: Diagnosis not present

## 2017-11-02 DIAGNOSIS — E039 Hypothyroidism, unspecified: Secondary | ICD-10-CM | POA: Diagnosis not present

## 2017-11-02 DIAGNOSIS — R42 Dizziness and giddiness: Secondary | ICD-10-CM | POA: Diagnosis not present

## 2017-11-02 DIAGNOSIS — K210 Gastro-esophageal reflux disease with esophagitis: Secondary | ICD-10-CM | POA: Diagnosis not present

## 2017-11-02 DIAGNOSIS — D696 Thrombocytopenia, unspecified: Secondary | ICD-10-CM | POA: Diagnosis not present

## 2017-11-02 DIAGNOSIS — C9142 Hairy cell leukemia, in relapse: Secondary | ICD-10-CM | POA: Diagnosis not present

## 2017-11-02 DIAGNOSIS — D649 Anemia, unspecified: Secondary | ICD-10-CM | POA: Diagnosis not present

## 2017-11-06 DIAGNOSIS — N401 Enlarged prostate with lower urinary tract symptoms: Secondary | ICD-10-CM | POA: Diagnosis not present

## 2017-11-06 DIAGNOSIS — D696 Thrombocytopenia, unspecified: Secondary | ICD-10-CM | POA: Diagnosis not present

## 2017-11-06 DIAGNOSIS — Z1389 Encounter for screening for other disorder: Secondary | ICD-10-CM | POA: Diagnosis not present

## 2017-11-06 DIAGNOSIS — Z0001 Encounter for general adult medical examination with abnormal findings: Secondary | ICD-10-CM | POA: Diagnosis not present

## 2017-11-06 DIAGNOSIS — Z1331 Encounter for screening for depression: Secondary | ICD-10-CM | POA: Diagnosis not present

## 2017-11-06 DIAGNOSIS — J45909 Unspecified asthma, uncomplicated: Secondary | ICD-10-CM | POA: Diagnosis not present

## 2017-11-06 DIAGNOSIS — Z6826 Body mass index (BMI) 26.0-26.9, adult: Secondary | ICD-10-CM | POA: Diagnosis not present

## 2017-11-06 DIAGNOSIS — D649 Anemia, unspecified: Secondary | ICD-10-CM | POA: Diagnosis not present

## 2017-11-06 DIAGNOSIS — C9142 Hairy cell leukemia, in relapse: Secondary | ICD-10-CM | POA: Diagnosis not present

## 2017-12-06 DIAGNOSIS — Z452 Encounter for adjustment and management of vascular access device: Secondary | ICD-10-CM | POA: Diagnosis not present

## 2017-12-19 DIAGNOSIS — Z800 Family history of malignant neoplasm of digestive organs: Secondary | ICD-10-CM | POA: Diagnosis not present

## 2017-12-19 DIAGNOSIS — Z8501 Personal history of malignant neoplasm of esophagus: Secondary | ICD-10-CM | POA: Diagnosis not present

## 2017-12-19 DIAGNOSIS — H9190 Unspecified hearing loss, unspecified ear: Secondary | ICD-10-CM | POA: Diagnosis not present

## 2017-12-19 DIAGNOSIS — Z79891 Long term (current) use of opiate analgesic: Secondary | ICD-10-CM | POA: Diagnosis not present

## 2017-12-19 DIAGNOSIS — Z7951 Long term (current) use of inhaled steroids: Secondary | ICD-10-CM | POA: Diagnosis not present

## 2017-12-19 DIAGNOSIS — K449 Diaphragmatic hernia without obstruction or gangrene: Secondary | ICD-10-CM | POA: Diagnosis not present

## 2017-12-19 DIAGNOSIS — K2270 Barrett's esophagus without dysplasia: Secondary | ICD-10-CM | POA: Diagnosis not present

## 2017-12-19 DIAGNOSIS — Z8719 Personal history of other diseases of the digestive system: Secondary | ICD-10-CM | POA: Diagnosis not present

## 2017-12-19 DIAGNOSIS — Z09 Encounter for follow-up examination after completed treatment for conditions other than malignant neoplasm: Secondary | ICD-10-CM | POA: Diagnosis not present

## 2017-12-19 DIAGNOSIS — J449 Chronic obstructive pulmonary disease, unspecified: Secondary | ICD-10-CM | POA: Diagnosis not present

## 2017-12-19 DIAGNOSIS — Z856 Personal history of leukemia: Secondary | ICD-10-CM | POA: Diagnosis not present

## 2017-12-19 DIAGNOSIS — Z79899 Other long term (current) drug therapy: Secondary | ICD-10-CM | POA: Diagnosis not present

## 2017-12-22 DIAGNOSIS — Z6825 Body mass index (BMI) 25.0-25.9, adult: Secondary | ICD-10-CM | POA: Diagnosis not present

## 2017-12-22 DIAGNOSIS — K5901 Slow transit constipation: Secondary | ICD-10-CM | POA: Diagnosis not present

## 2017-12-28 DIAGNOSIS — M545 Low back pain: Secondary | ICD-10-CM | POA: Diagnosis not present

## 2017-12-28 DIAGNOSIS — Z6825 Body mass index (BMI) 25.0-25.9, adult: Secondary | ICD-10-CM | POA: Diagnosis not present

## 2017-12-31 DIAGNOSIS — S60450A Superficial foreign body of right index finger, initial encounter: Secondary | ICD-10-CM | POA: Diagnosis not present

## 2017-12-31 DIAGNOSIS — N400 Enlarged prostate without lower urinary tract symptoms: Secondary | ICD-10-CM | POA: Diagnosis not present

## 2017-12-31 DIAGNOSIS — K219 Gastro-esophageal reflux disease without esophagitis: Secondary | ICD-10-CM | POA: Diagnosis not present

## 2017-12-31 DIAGNOSIS — S60221A Contusion of right hand, initial encounter: Secondary | ICD-10-CM | POA: Diagnosis not present

## 2017-12-31 DIAGNOSIS — Z7982 Long term (current) use of aspirin: Secondary | ICD-10-CM | POA: Diagnosis not present

## 2017-12-31 DIAGNOSIS — S6991XA Unspecified injury of right wrist, hand and finger(s), initial encounter: Secondary | ICD-10-CM | POA: Diagnosis not present

## 2017-12-31 DIAGNOSIS — Z79899 Other long term (current) drug therapy: Secondary | ICD-10-CM | POA: Diagnosis not present

## 2017-12-31 DIAGNOSIS — W228XXA Striking against or struck by other objects, initial encounter: Secondary | ICD-10-CM | POA: Diagnosis not present

## 2017-12-31 DIAGNOSIS — F329 Major depressive disorder, single episode, unspecified: Secondary | ICD-10-CM | POA: Diagnosis not present

## 2017-12-31 DIAGNOSIS — M25531 Pain in right wrist: Secondary | ICD-10-CM | POA: Diagnosis not present

## 2017-12-31 DIAGNOSIS — M19041 Primary osteoarthritis, right hand: Secondary | ICD-10-CM | POA: Diagnosis not present

## 2017-12-31 DIAGNOSIS — C9140 Hairy cell leukemia not having achieved remission: Secondary | ICD-10-CM | POA: Diagnosis not present

## 2018-01-02 DIAGNOSIS — M25551 Pain in right hip: Secondary | ICD-10-CM | POA: Diagnosis not present

## 2018-01-02 DIAGNOSIS — G3184 Mild cognitive impairment, so stated: Secondary | ICD-10-CM | POA: Diagnosis not present

## 2018-01-02 DIAGNOSIS — M25552 Pain in left hip: Secondary | ICD-10-CM | POA: Diagnosis not present

## 2018-01-02 DIAGNOSIS — S63511A Sprain of carpal joint of right wrist, initial encounter: Secondary | ICD-10-CM | POA: Diagnosis not present

## 2018-01-03 DIAGNOSIS — S63511D Sprain of carpal joint of right wrist, subsequent encounter: Secondary | ICD-10-CM | POA: Diagnosis not present

## 2018-01-03 DIAGNOSIS — M19041 Primary osteoarthritis, right hand: Secondary | ICD-10-CM | POA: Diagnosis not present

## 2018-01-05 DIAGNOSIS — S63511D Sprain of carpal joint of right wrist, subsequent encounter: Secondary | ICD-10-CM | POA: Diagnosis not present

## 2018-01-05 DIAGNOSIS — M19041 Primary osteoarthritis, right hand: Secondary | ICD-10-CM | POA: Diagnosis not present

## 2018-01-06 DIAGNOSIS — Z743 Need for continuous supervision: Secondary | ICD-10-CM | POA: Diagnosis not present

## 2018-01-06 DIAGNOSIS — J45909 Unspecified asthma, uncomplicated: Secondary | ICD-10-CM | POA: Diagnosis not present

## 2018-01-06 DIAGNOSIS — R296 Repeated falls: Secondary | ICD-10-CM | POA: Diagnosis present

## 2018-01-06 DIAGNOSIS — C9141 Hairy cell leukemia, in remission: Secondary | ICD-10-CM | POA: Diagnosis present

## 2018-01-06 DIAGNOSIS — D630 Anemia in neoplastic disease: Secondary | ICD-10-CM | POA: Diagnosis present

## 2018-01-06 DIAGNOSIS — M79642 Pain in left hand: Secondary | ICD-10-CM | POA: Diagnosis not present

## 2018-01-06 DIAGNOSIS — D649 Anemia, unspecified: Secondary | ICD-10-CM | POA: Diagnosis not present

## 2018-01-06 DIAGNOSIS — Z7982 Long term (current) use of aspirin: Secondary | ICD-10-CM | POA: Diagnosis not present

## 2018-01-06 DIAGNOSIS — C9140 Hairy cell leukemia not having achieved remission: Secondary | ICD-10-CM | POA: Diagnosis not present

## 2018-01-06 DIAGNOSIS — I517 Cardiomegaly: Secondary | ICD-10-CM | POA: Diagnosis not present

## 2018-01-06 DIAGNOSIS — Z800 Family history of malignant neoplasm of digestive organs: Secondary | ICD-10-CM | POA: Diagnosis not present

## 2018-01-06 DIAGNOSIS — E871 Hypo-osmolality and hyponatremia: Secondary | ICD-10-CM | POA: Diagnosis not present

## 2018-01-06 DIAGNOSIS — R531 Weakness: Secondary | ICD-10-CM | POA: Diagnosis not present

## 2018-01-06 DIAGNOSIS — M19042 Primary osteoarthritis, left hand: Secondary | ICD-10-CM | POA: Diagnosis not present

## 2018-01-06 DIAGNOSIS — C9142 Hairy cell leukemia, in relapse: Secondary | ICD-10-CM | POA: Diagnosis not present

## 2018-01-06 DIAGNOSIS — Z9221 Personal history of antineoplastic chemotherapy: Secondary | ICD-10-CM | POA: Diagnosis not present

## 2018-01-06 DIAGNOSIS — D61818 Other pancytopenia: Secondary | ICD-10-CM | POA: Diagnosis not present

## 2018-01-06 DIAGNOSIS — M79641 Pain in right hand: Secondary | ICD-10-CM | POA: Diagnosis not present

## 2018-01-06 DIAGNOSIS — C9500 Acute leukemia of unspecified cell type not having achieved remission: Secondary | ICD-10-CM | POA: Diagnosis not present

## 2018-01-06 DIAGNOSIS — Z7951 Long term (current) use of inhaled steroids: Secondary | ICD-10-CM | POA: Diagnosis not present

## 2018-01-06 DIAGNOSIS — R509 Fever, unspecified: Secondary | ICD-10-CM | POA: Diagnosis not present

## 2018-01-06 DIAGNOSIS — S3991XA Unspecified injury of abdomen, initial encounter: Secondary | ICD-10-CM | POA: Diagnosis not present

## 2018-01-06 DIAGNOSIS — K922 Gastrointestinal hemorrhage, unspecified: Secondary | ICD-10-CM | POA: Diagnosis not present

## 2018-01-06 DIAGNOSIS — M542 Cervicalgia: Secondary | ICD-10-CM | POA: Diagnosis not present

## 2018-01-06 DIAGNOSIS — E039 Hypothyroidism, unspecified: Secondary | ICD-10-CM | POA: Diagnosis present

## 2018-01-06 DIAGNOSIS — R5381 Other malaise: Secondary | ICD-10-CM | POA: Diagnosis present

## 2018-01-06 DIAGNOSIS — F329 Major depressive disorder, single episode, unspecified: Secondary | ICD-10-CM | POA: Diagnosis not present

## 2018-01-06 DIAGNOSIS — M1812 Unilateral primary osteoarthritis of first carpometacarpal joint, left hand: Secondary | ICD-10-CM | POA: Diagnosis not present

## 2018-01-06 DIAGNOSIS — J90 Pleural effusion, not elsewhere classified: Secondary | ICD-10-CM | POA: Diagnosis not present

## 2018-01-06 DIAGNOSIS — Z856 Personal history of leukemia: Secondary | ICD-10-CM | POA: Diagnosis not present

## 2018-01-06 DIAGNOSIS — F17210 Nicotine dependence, cigarettes, uncomplicated: Secondary | ICD-10-CM | POA: Diagnosis present

## 2018-01-06 DIAGNOSIS — Z8501 Personal history of malignant neoplasm of esophagus: Secondary | ICD-10-CM | POA: Diagnosis not present

## 2018-01-06 DIAGNOSIS — Z91018 Allergy to other foods: Secondary | ICD-10-CM | POA: Diagnosis not present

## 2018-01-06 DIAGNOSIS — M25462 Effusion, left knee: Secondary | ICD-10-CM | POA: Diagnosis not present

## 2018-01-06 DIAGNOSIS — Z6825 Body mass index (BMI) 25.0-25.9, adult: Secondary | ICD-10-CM | POA: Diagnosis not present

## 2018-01-06 DIAGNOSIS — D72829 Elevated white blood cell count, unspecified: Secondary | ICD-10-CM | POA: Diagnosis not present

## 2018-01-06 DIAGNOSIS — Z923 Personal history of irradiation: Secondary | ICD-10-CM | POA: Diagnosis not present

## 2018-01-06 DIAGNOSIS — M1712 Unilateral primary osteoarthritis, left knee: Secondary | ICD-10-CM | POA: Diagnosis not present

## 2018-01-06 DIAGNOSIS — R279 Unspecified lack of coordination: Secondary | ICD-10-CM | POA: Diagnosis not present

## 2018-01-06 DIAGNOSIS — M25531 Pain in right wrist: Secondary | ICD-10-CM | POA: Diagnosis not present

## 2018-01-06 DIAGNOSIS — C9200 Acute myeloblastic leukemia, not having achieved remission: Secondary | ICD-10-CM | POA: Diagnosis present

## 2018-01-06 DIAGNOSIS — J189 Pneumonia, unspecified organism: Secondary | ICD-10-CM | POA: Diagnosis not present

## 2018-01-06 DIAGNOSIS — M25562 Pain in left knee: Secondary | ICD-10-CM | POA: Diagnosis present

## 2018-01-06 DIAGNOSIS — M79645 Pain in left finger(s): Secondary | ICD-10-CM | POA: Diagnosis not present

## 2018-01-06 DIAGNOSIS — Z79899 Other long term (current) drug therapy: Secondary | ICD-10-CM | POA: Diagnosis not present

## 2018-01-06 DIAGNOSIS — K3183 Achlorhydria: Secondary | ICD-10-CM | POA: Diagnosis not present

## 2018-01-06 DIAGNOSIS — M2550 Pain in unspecified joint: Secondary | ICD-10-CM | POA: Diagnosis not present

## 2018-01-06 DIAGNOSIS — C159 Malignant neoplasm of esophagus, unspecified: Secondary | ICD-10-CM | POA: Diagnosis not present

## 2018-01-06 DIAGNOSIS — D696 Thrombocytopenia, unspecified: Secondary | ICD-10-CM | POA: Diagnosis present

## 2018-01-06 DIAGNOSIS — N400 Enlarged prostate without lower urinary tract symptoms: Secondary | ICD-10-CM | POA: Diagnosis present

## 2018-01-06 DIAGNOSIS — K219 Gastro-esophageal reflux disease without esophagitis: Secondary | ICD-10-CM | POA: Diagnosis present

## 2018-01-06 DIAGNOSIS — Z452 Encounter for adjustment and management of vascular access device: Secondary | ICD-10-CM | POA: Diagnosis not present

## 2018-01-06 DIAGNOSIS — S301XXA Contusion of abdominal wall, initial encounter: Secondary | ICD-10-CM | POA: Diagnosis not present

## 2018-01-06 DIAGNOSIS — R040 Epistaxis: Secondary | ICD-10-CM | POA: Diagnosis not present

## 2018-01-17 DIAGNOSIS — K219 Gastro-esophageal reflux disease without esophagitis: Secondary | ICD-10-CM | POA: Diagnosis not present

## 2018-01-17 DIAGNOSIS — C9290 Myeloid leukemia, unspecified, not having achieved remission: Secondary | ICD-10-CM | POA: Diagnosis not present

## 2018-01-17 DIAGNOSIS — C3490 Malignant neoplasm of unspecified part of unspecified bronchus or lung: Secondary | ICD-10-CM | POA: Diagnosis not present

## 2018-01-17 DIAGNOSIS — R531 Weakness: Secondary | ICD-10-CM | POA: Diagnosis not present

## 2018-01-17 DIAGNOSIS — R52 Pain, unspecified: Secondary | ICD-10-CM | POA: Diagnosis not present

## 2018-01-17 DIAGNOSIS — C7931 Secondary malignant neoplasm of brain: Secondary | ICD-10-CM | POA: Diagnosis not present

## 2018-01-19 DIAGNOSIS — C9290 Myeloid leukemia, unspecified, not having achieved remission: Secondary | ICD-10-CM | POA: Diagnosis not present

## 2018-01-19 DIAGNOSIS — R52 Pain, unspecified: Secondary | ICD-10-CM | POA: Diagnosis not present

## 2018-01-19 DIAGNOSIS — K219 Gastro-esophageal reflux disease without esophagitis: Secondary | ICD-10-CM | POA: Diagnosis not present

## 2018-01-19 DIAGNOSIS — C7931 Secondary malignant neoplasm of brain: Secondary | ICD-10-CM | POA: Diagnosis not present

## 2018-01-19 DIAGNOSIS — R531 Weakness: Secondary | ICD-10-CM | POA: Diagnosis not present

## 2018-01-19 DIAGNOSIS — C3490 Malignant neoplasm of unspecified part of unspecified bronchus or lung: Secondary | ICD-10-CM | POA: Diagnosis not present

## 2018-01-21 DIAGNOSIS — R52 Pain, unspecified: Secondary | ICD-10-CM | POA: Diagnosis not present

## 2018-01-21 DIAGNOSIS — C3490 Malignant neoplasm of unspecified part of unspecified bronchus or lung: Secondary | ICD-10-CM | POA: Diagnosis not present

## 2018-01-21 DIAGNOSIS — K219 Gastro-esophageal reflux disease without esophagitis: Secondary | ICD-10-CM | POA: Diagnosis not present

## 2018-01-21 DIAGNOSIS — R531 Weakness: Secondary | ICD-10-CM | POA: Diagnosis not present

## 2018-01-21 DIAGNOSIS — C7931 Secondary malignant neoplasm of brain: Secondary | ICD-10-CM | POA: Diagnosis not present

## 2018-01-21 DIAGNOSIS — C9142 Hairy cell leukemia, in relapse: Secondary | ICD-10-CM | POA: Diagnosis not present

## 2018-01-21 DIAGNOSIS — C9290 Myeloid leukemia, unspecified, not having achieved remission: Secondary | ICD-10-CM | POA: Diagnosis not present

## 2018-01-22 DIAGNOSIS — R52 Pain, unspecified: Secondary | ICD-10-CM | POA: Diagnosis not present

## 2018-01-22 DIAGNOSIS — R531 Weakness: Secondary | ICD-10-CM | POA: Diagnosis not present

## 2018-01-22 DIAGNOSIS — R319 Hematuria, unspecified: Secondary | ICD-10-CM | POA: Diagnosis not present

## 2018-01-22 DIAGNOSIS — K219 Gastro-esophageal reflux disease without esophagitis: Secondary | ICD-10-CM | POA: Diagnosis not present

## 2018-01-22 DIAGNOSIS — C7931 Secondary malignant neoplasm of brain: Secondary | ICD-10-CM | POA: Diagnosis not present

## 2018-01-22 DIAGNOSIS — C9290 Myeloid leukemia, unspecified, not having achieved remission: Secondary | ICD-10-CM | POA: Diagnosis not present

## 2018-01-22 DIAGNOSIS — C3490 Malignant neoplasm of unspecified part of unspecified bronchus or lung: Secondary | ICD-10-CM | POA: Diagnosis not present

## 2018-01-23 DIAGNOSIS — K219 Gastro-esophageal reflux disease without esophagitis: Secondary | ICD-10-CM | POA: Diagnosis not present

## 2018-01-23 DIAGNOSIS — C3490 Malignant neoplasm of unspecified part of unspecified bronchus or lung: Secondary | ICD-10-CM | POA: Diagnosis not present

## 2018-01-23 DIAGNOSIS — C7931 Secondary malignant neoplasm of brain: Secondary | ICD-10-CM | POA: Diagnosis not present

## 2018-01-23 DIAGNOSIS — C9290 Myeloid leukemia, unspecified, not having achieved remission: Secondary | ICD-10-CM | POA: Diagnosis not present

## 2018-01-23 DIAGNOSIS — R52 Pain, unspecified: Secondary | ICD-10-CM | POA: Diagnosis not present

## 2018-01-23 DIAGNOSIS — R531 Weakness: Secondary | ICD-10-CM | POA: Diagnosis not present

## 2018-01-24 DIAGNOSIS — K219 Gastro-esophageal reflux disease without esophagitis: Secondary | ICD-10-CM | POA: Diagnosis not present

## 2018-01-24 DIAGNOSIS — R531 Weakness: Secondary | ICD-10-CM | POA: Diagnosis not present

## 2018-01-24 DIAGNOSIS — C9290 Myeloid leukemia, unspecified, not having achieved remission: Secondary | ICD-10-CM | POA: Diagnosis not present

## 2018-01-24 DIAGNOSIS — C3490 Malignant neoplasm of unspecified part of unspecified bronchus or lung: Secondary | ICD-10-CM | POA: Diagnosis not present

## 2018-01-24 DIAGNOSIS — C7931 Secondary malignant neoplasm of brain: Secondary | ICD-10-CM | POA: Diagnosis not present

## 2018-01-24 DIAGNOSIS — R52 Pain, unspecified: Secondary | ICD-10-CM | POA: Diagnosis not present

## 2018-01-25 DIAGNOSIS — C9290 Myeloid leukemia, unspecified, not having achieved remission: Secondary | ICD-10-CM | POA: Diagnosis not present

## 2018-01-25 DIAGNOSIS — C3490 Malignant neoplasm of unspecified part of unspecified bronchus or lung: Secondary | ICD-10-CM | POA: Diagnosis not present

## 2018-01-25 DIAGNOSIS — R531 Weakness: Secondary | ICD-10-CM | POA: Diagnosis not present

## 2018-01-25 DIAGNOSIS — R52 Pain, unspecified: Secondary | ICD-10-CM | POA: Diagnosis not present

## 2018-01-25 DIAGNOSIS — C7931 Secondary malignant neoplasm of brain: Secondary | ICD-10-CM | POA: Diagnosis not present

## 2018-01-25 DIAGNOSIS — K219 Gastro-esophageal reflux disease without esophagitis: Secondary | ICD-10-CM | POA: Diagnosis not present

## 2018-02-15 DEATH — deceased
